# Patient Record
Sex: Male | Born: 1990 | Race: White | Hispanic: No | Marital: Single | Smoking: Current some day smoker
Health system: Southern US, Community
[De-identification: ages and names within clinical notes are randomized; demographics above are authoritative.]

## PROBLEM LIST (undated history)

## (undated) DIAGNOSIS — E1010 Type 1 diabetes mellitus with ketoacidosis without coma: Secondary | ICD-10-CM

## (undated) DIAGNOSIS — E119 Type 2 diabetes mellitus without complications: Secondary | ICD-10-CM

---
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
## 2012-08-06 ENCOUNTER — Emergency Department: Payer: Self-pay | Admitting: Emergency Medicine

## 2012-10-24 ENCOUNTER — Inpatient Hospital Stay: Payer: Self-pay | Admitting: Internal Medicine

## 2012-10-24 LAB — BASIC METABOLIC PANEL
Anion Gap: 10 (ref 7–16)
Anion Gap: 5 — ABNORMAL LOW (ref 7–16)
BUN: 20 mg/dL — ABNORMAL HIGH (ref 7–18)
Calcium, Total: 7.8 mg/dL — ABNORMAL LOW (ref 8.5–10.1)
Calcium, Total: 7.7 mg/dL — ABNORMAL LOW (ref 8.5–10.1)
Chloride: 110 mmol/L — ABNORMAL HIGH (ref 98–107)
Chloride: 110 mmol/L — ABNORMAL HIGH (ref 98–107)
Co2: 20 mmol/L — ABNORMAL LOW (ref 21–32)
Co2: 25 mmol/L (ref 21–32)
Creatinine: 1.38 mg/dL — ABNORMAL HIGH (ref 0.60–1.30)
Creatinine: 1.44 mg/dL — ABNORMAL HIGH (ref 0.60–1.30)
EGFR (African American): >60
Glucose: 200 mg/dL — ABNORMAL HIGH (ref 65–99)
Osmolality: 284 (ref 275–301)
Osmolality: 287 (ref 275–301)
Potassium: 4.4 mmol/L (ref 3.5–5.1)
Potassium: 3.8 mmol/L (ref 3.5–5.1)
Sodium: 140 mmol/L (ref 136–145)

## 2012-10-24 LAB — URINALYSIS, COMPLETE
Bacteria: NONE SEEN
Bilirubin,UR: NEGATIVE
Nitrite: NEGATIVE
Protein: NEGATIVE
RBC,UR: 29 /HPF (ref 0–5)
Squamous Epithelial: NONE SEEN
WBC UR: 1 /HPF (ref 0–5)

## 2012-10-24 LAB — COMPREHENSIVE METABOLIC PANEL
Albumin: 4.7 g/dL (ref 3.4–5.0)
Alkaline Phosphatase: 171 U/L — ABNORMAL HIGH (ref 50–136)
Anion Gap: 26 — ABNORMAL HIGH (ref 7–16)
BUN: 29 mg/dL — ABNORMAL HIGH (ref 7–18)
Calcium, Total: 9.6 mg/dL (ref 8.5–10.1)
Co2: 9 mmol/L — CL (ref 21–32)
Creatinine: 1.45 mg/dL — ABNORMAL HIGH (ref 0.60–1.30)
EGFR (African American): >60
Glucose: 443 mg/dL — ABNORMAL HIGH (ref 65–99)
SGOT(AST): 29 U/L (ref 15–37)
Total Protein: 8 g/dL (ref 6.4–8.2)

## 2012-10-24 LAB — CBC
HCT: 49.1 % (ref 40.0–52.0)
HGB: 16.5 g/dL (ref 13.0–18.0)
RDW: 13 % (ref 11.5–14.5)
WBC: 26.9 10*3/uL — ABNORMAL HIGH (ref 3.8–10.6)

## 2012-10-24 LAB — LIPASE, BLOOD: Lipase: 48 U/L — ABNORMAL LOW (ref 73–393)

## 2012-10-31 LAB — COMPREHENSIVE METABOLIC PANEL
Albumin: 4.5 g/dL (ref 3.4–5.0)
Alkaline Phosphatase: 150 U/L — ABNORMAL HIGH (ref 50–136)
Bilirubin,Total: 2.5 mg/dL — ABNORMAL HIGH (ref 0.2–1.0)
Co2: 11 mmol/L — ABNORMAL LOW (ref 21–32)
Creatinine: 1.47 mg/dL — ABNORMAL HIGH (ref 0.60–1.30)
EGFR (African American): >60
EGFR (Non-African Amer.): >60
Glucose: 319 mg/dL — ABNORMAL HIGH (ref 65–99)
Osmolality: 273 (ref 275–301)
Sodium: 128 mmol/L — ABNORMAL LOW (ref 136–145)
Total Protein: 8.2 g/dL (ref 6.4–8.2)

## 2012-10-31 LAB — CBC
HCT: 50.3 % (ref 40.0–52.0)
HGB: 16.4 g/dL (ref 13.0–18.0)
MCH: 28.9 pg (ref 26.0–34.0)
MCHC: 32.5 g/dL (ref 32.0–36.0)
MCV: 89 fL (ref 80–100)
Platelet: 270 10*3/uL (ref 150–440)
RBC: 5.66 10*6/uL (ref 4.40–5.90)
WBC: 11.4 10*3/uL — ABNORMAL HIGH (ref 3.8–10.6)

## 2012-11-01 ENCOUNTER — Inpatient Hospital Stay: Payer: Self-pay | Admitting: Internal Medicine

## 2012-11-01 LAB — URINALYSIS, COMPLETE
Bacteria: NONE SEEN
Bilirubin,UR: NEGATIVE
Glucose,UR: >=500 mg/dL (ref 0–75)
Leukocyte Esterase: NEGATIVE
Nitrite: NEGATIVE
Protein: 30
RBC,UR: <1 /HPF (ref 0–5)
Specific Gravity: 1.023 (ref 1.003–1.030)
Squamous Epithelial: <1
WBC UR: <1 /HPF (ref 0–5)

## 2012-11-01 LAB — BASIC METABOLIC PANEL
Anion Gap: 11 (ref 7–16)
Calcium, Total: 8 mg/dL — ABNORMAL LOW (ref 8.5–10.1)
Chloride: 108 mmol/L — ABNORMAL HIGH (ref 98–107)
Creatinine: 1.15 mg/dL (ref 0.60–1.30)
EGFR (African American): >60
EGFR (Non-African Amer.): >60
Osmolality: 270 (ref 275–301)
Sodium: 135 mmol/L — ABNORMAL LOW (ref 136–145)

## 2013-05-04 ENCOUNTER — Inpatient Hospital Stay: Payer: Self-pay | Admitting: Internal Medicine

## 2013-05-04 LAB — COMPREHENSIVE METABOLIC PANEL
ANION GAP: 16 (ref 7–16)
Albumin: 3.8 g/dL (ref 3.4–5.0)
Albumin: 2.9 g/dL — ABNORMAL LOW (ref 3.4–5.0)
Alkaline Phosphatase: 236 U/L — ABNORMAL HIGH
Alkaline Phosphatase: 144 U/L — ABNORMAL HIGH
Anion Gap: 26 — ABNORMAL HIGH (ref 7–16)
BILIRUBIN TOTAL: 0.9 mg/dL (ref 0.2–1.0)
BILIRUBIN TOTAL: 0.7 mg/dL (ref 0.2–1.0)
BUN: 14 mg/dL (ref 7–18)
BUN: 13 mg/dL (ref 7–18)
CHLORIDE: 96 mmol/L — AB (ref 98–107)
CO2: 6 mmol/L — AB (ref 21–32)
CREATININE: 1.09 mg/dL (ref 0.60–1.30)
CREATININE: 0.91 mg/dL (ref 0.60–1.30)
Calcium, Total: 8.2 mg/dL — ABNORMAL LOW (ref 8.5–10.1)
Calcium, Total: 7 mg/dL — CL (ref 8.5–10.1)
Chloride: 110 mmol/L — ABNORMAL HIGH (ref 98–107)
Co2: 10 mmol/L — CL (ref 21–32)
EGFR (African American): >60
EGFR (African American): >60
Glucose: 545 mg/dL (ref 65–99)
Glucose: 156 mg/dL — ABNORMAL HIGH (ref 65–99)
OSMOLALITY: 282 (ref 275–301)
Osmolality: 275 (ref 275–301)
POTASSIUM: 3.3 mmol/L — AB (ref 3.5–5.1)
Potassium: 3.9 mmol/L (ref 3.5–5.1)
SGOT(AST): 25 U/L (ref 15–37)
SGOT(AST): 21 U/L (ref 15–37)
SGPT (ALT): 40 U/L (ref 12–78)
SGPT (ALT): 28 U/L (ref 12–78)
SODIUM: 128 mmol/L — AB (ref 136–145)
Sodium: 136 mmol/L (ref 136–145)
TOTAL PROTEIN: 6 g/dL — AB (ref 6.4–8.2)
Total Protein: 7.8 g/dL (ref 6.4–8.2)

## 2013-05-04 LAB — CBC WITH DIFFERENTIAL/PLATELET
BASOS ABS: 0.2 10*3/uL — AB (ref 0.0–0.1)
Basophil %: 2.7 %
EOS ABS: 0.1 10*3/uL (ref 0.0–0.7)
EOS PCT: 2.4 %
HCT: 45.3 % (ref 40.0–52.0)
HGB: 15 g/dL (ref 13.0–18.0)
LYMPHS ABS: 1.5 10*3/uL (ref 1.0–3.6)
Lymphocyte %: 24.5 %
MCH: 32.2 pg (ref 26.0–34.0)
MCHC: 33 g/dL (ref 32.0–36.0)
MCV: 98 fL (ref 80–100)
Monocyte #: 0.4 x10 3/mm (ref 0.2–1.0)
Monocyte %: 6 %
Neutrophil #: 3.9 10*3/uL (ref 1.4–6.5)
Neutrophil %: 64.4 %
Platelet: 339 10*3/uL (ref 150–440)
RBC: 4.64 10*6/uL (ref 4.40–5.90)
RDW: 13.1 % (ref 11.5–14.5)
WBC: 6.1 10*3/uL (ref 3.8–10.6)

## 2013-05-04 LAB — URINALYSIS, COMPLETE
BACTERIA: NONE SEEN
Bilirubin,UR: NEGATIVE
Glucose,UR: >=500 mg/dL (ref 0–75)
LEUKOCYTE ESTERASE: NEGATIVE
Nitrite: NEGATIVE
Ph: 5 (ref 4.5–8.0)
Protein: 30
RBC,UR: <1 /HPF (ref 0–5)
SPECIFIC GRAVITY: 1.022 (ref 1.003–1.030)
Squamous Epithelial: NONE SEEN

## 2013-05-04 LAB — HEMOGLOBIN A1C: Hemoglobin A1C: 12.3 % — ABNORMAL HIGH (ref 4.2–6.3)

## 2013-05-04 LAB — TROPONIN I: Troponin-I: <0.02 ng/mL

## 2013-05-05 LAB — CBC WITH DIFFERENTIAL/PLATELET
Basophil #: 0.1 10*3/uL (ref 0.0–0.1)
Basophil %: 1 %
EOS ABS: 0.2 10*3/uL (ref 0.0–0.7)
EOS PCT: 3.6 %
HCT: 36.6 % — ABNORMAL LOW (ref 40.0–52.0)
HGB: 12.5 g/dL — ABNORMAL LOW (ref 13.0–18.0)
Lymphocyte #: 1.2 10*3/uL (ref 1.0–3.6)
Lymphocyte %: 18.7 %
MCH: 32.2 pg (ref 26.0–34.0)
MCHC: 34.2 g/dL (ref 32.0–36.0)
MCV: 94 fL (ref 80–100)
MONOS PCT: 5.8 %
Monocyte #: 0.4 x10 3/mm (ref 0.2–1.0)
NEUTROS ABS: 4.4 10*3/uL (ref 1.4–6.5)
Neutrophil %: 70.9 %
Platelet: 262 10*3/uL (ref 150–440)
RBC: 3.89 10*6/uL — AB (ref 4.40–5.90)
RDW: 13.2 % (ref 11.5–14.5)
WBC: 6.2 10*3/uL (ref 3.8–10.6)

## 2013-05-05 LAB — COMPREHENSIVE METABOLIC PANEL
ALBUMIN: 2.7 g/dL — AB (ref 3.4–5.0)
ANION GAP: 16 (ref 7–16)
AST: 22 U/L (ref 15–37)
Alkaline Phosphatase: 148 U/L — ABNORMAL HIGH
BUN: 12 mg/dL (ref 7–18)
Bilirubin,Total: 0.7 mg/dL (ref 0.2–1.0)
CHLORIDE: 108 mmol/L — AB (ref 98–107)
Calcium, Total: 7.2 mg/dL — ABNORMAL LOW (ref 8.5–10.1)
Co2: 11 mmol/L — ABNORMAL LOW (ref 21–32)
Creatinine: 1.02 mg/dL (ref 0.60–1.30)
GLUCOSE: 206 mg/dL — AB (ref 65–99)
Osmolality: 276 (ref 275–301)
Potassium: 3 mmol/L — ABNORMAL LOW (ref 3.5–5.1)
SGPT (ALT): 33 U/L (ref 12–78)
SODIUM: 135 mmol/L — AB (ref 136–145)
Total Protein: 5.7 g/dL — ABNORMAL LOW (ref 6.4–8.2)

## 2013-05-05 LAB — BASIC METABOLIC PANEL
ANION GAP: 7 (ref 7–16)
Anion Gap: 8 (ref 7–16)
BUN: 10 mg/dL (ref 7–18)
BUN: 10 mg/dL (ref 7–18)
CALCIUM: 7.5 mg/dL — AB (ref 8.5–10.1)
CHLORIDE: 108 mmol/L — AB (ref 98–107)
CO2: 18 mmol/L — AB (ref 21–32)
CO2: 21 mmol/L (ref 21–32)
Calcium, Total: 7.8 mg/dL — ABNORMAL LOW (ref 8.5–10.1)
Chloride: 108 mmol/L — ABNORMAL HIGH (ref 98–107)
Creatinine: 1.02 mg/dL (ref 0.60–1.30)
Creatinine: 1.09 mg/dL (ref 0.60–1.30)
EGFR (African American): >60
EGFR (Non-African Amer.): >60
EGFR (Non-African Amer.): >60
Glucose: 176 mg/dL — ABNORMAL HIGH (ref 65–99)
Glucose: 159 mg/dL — ABNORMAL HIGH (ref 65–99)
OSMOLALITY: 272 (ref 275–301)
Osmolality: 274 (ref 275–301)
Potassium: 2.7 mmol/L — ABNORMAL LOW (ref 3.5–5.1)
Potassium: 2.8 mmol/L — ABNORMAL LOW (ref 3.5–5.1)
SODIUM: 134 mmol/L — AB (ref 136–145)
Sodium: 136 mmol/L (ref 136–145)

## 2013-05-06 LAB — BASIC METABOLIC PANEL
ANION GAP: 6 — AB (ref 7–16)
Anion Gap: 6 — ABNORMAL LOW (ref 7–16)
BUN: 7 mg/dL (ref 7–18)
BUN: 8 mg/dL (ref 7–18)
CALCIUM: 7.6 mg/dL — AB (ref 8.5–10.1)
CREATININE: 0.81 mg/dL (ref 0.60–1.30)
Calcium, Total: 7.6 mg/dL — ABNORMAL LOW (ref 8.5–10.1)
Chloride: 111 mmol/L — ABNORMAL HIGH (ref 98–107)
Chloride: 114 mmol/L — ABNORMAL HIGH (ref 98–107)
Co2: 22 mmol/L (ref 21–32)
Co2: 22 mmol/L (ref 21–32)
Creatinine: 0.95 mg/dL (ref 0.60–1.30)
EGFR (African American): >60
EGFR (African American): >60
EGFR (Non-African Amer.): >60
GLUCOSE: 70 mg/dL (ref 65–99)
Glucose: 82 mg/dL (ref 65–99)
OSMOLALITY: 280 (ref 275–301)
Osmolality: 275 (ref 275–301)
Potassium: 2.9 mmol/L — ABNORMAL LOW (ref 3.5–5.1)
Potassium: 2.9 mmol/L — ABNORMAL LOW (ref 3.5–5.1)
Sodium: 139 mmol/L (ref 136–145)
Sodium: 142 mmol/L (ref 136–145)

## 2013-05-06 LAB — MAGNESIUM: MAGNESIUM: 1.9 mg/dL

## 2013-07-01 ENCOUNTER — Inpatient Hospital Stay: Payer: Self-pay | Admitting: Internal Medicine

## 2013-07-01 LAB — URINALYSIS, COMPLETE
Bacteria: NONE SEEN
Bilirubin,UR: NEGATIVE
Glucose,UR: >=500 mg/dL (ref 0–75)
Leukocyte Esterase: NEGATIVE
NITRITE: NEGATIVE
PH: 5 (ref 4.5–8.0)
PROTEIN: NEGATIVE
RBC,UR: 6 /HPF (ref 0–5)
SQUAMOUS EPITHELIAL: NONE SEEN
Specific Gravity: 1.026 (ref 1.003–1.030)

## 2013-07-01 LAB — CBC WITH DIFFERENTIAL/PLATELET
BASOS ABS: 0.1 10*3/uL (ref 0.0–0.1)
BASOS PCT: 0.5 %
EOS PCT: 0 %
Eosinophil #: 0 10*3/uL (ref 0.0–0.7)
HCT: 45.4 % (ref 40.0–52.0)
HGB: 14.8 g/dL (ref 13.0–18.0)
LYMPHS PCT: 5.4 %
Lymphocyte #: 0.7 10*3/uL — ABNORMAL LOW (ref 1.0–3.6)
MCH: 29.7 pg (ref 26.0–34.0)
MCHC: 32.5 g/dL (ref 32.0–36.0)
MCV: 91 fL (ref 80–100)
Monocyte #: 0.4 x10 3/mm (ref 0.2–1.0)
Monocyte %: 3.7 %
Neutrophil #: 11 10*3/uL — ABNORMAL HIGH (ref 1.4–6.5)
Neutrophil %: 90.4 %
PLATELETS: 323 10*3/uL (ref 150–440)
RBC: 4.97 10*6/uL (ref 4.40–5.90)
RDW: 12.4 % (ref 11.5–14.5)
WBC: 12.2 10*3/uL — ABNORMAL HIGH (ref 3.8–10.6)

## 2013-07-01 LAB — BASIC METABOLIC PANEL
ANION GAP: 10 (ref 7–16)
Anion Gap: 28 — ABNORMAL HIGH (ref 7–16)
BUN: 15 mg/dL (ref 7–18)
BUN: 11 mg/dL (ref 7–18)
CHLORIDE: 110 mmol/L — AB (ref 98–107)
CO2: 10 mmol/L — AB (ref 21–32)
CREATININE: 1.11 mg/dL (ref 0.60–1.30)
Calcium, Total: 9.1 mg/dL (ref 8.5–10.1)
Calcium, Total: 7.3 mg/dL — ABNORMAL LOW (ref 8.5–10.1)
Chloride: 88 mmol/L — ABNORMAL LOW (ref 98–107)
Co2: 18 mmol/L — ABNORMAL LOW (ref 21–32)
Creatinine: 1.37 mg/dL — ABNORMAL HIGH (ref 0.60–1.30)
EGFR (African American): >60
EGFR (Non-African Amer.): >60
Glucose: 588 mg/dL (ref 65–99)
Glucose: 163 mg/dL — ABNORMAL HIGH (ref 65–99)
Osmolality: 281 (ref 275–301)
Osmolality: 279 (ref 275–301)
POTASSIUM: 4.5 mmol/L (ref 3.5–5.1)
Potassium: 2.9 mmol/L — ABNORMAL LOW (ref 3.5–5.1)
Sodium: 126 mmol/L — ABNORMAL LOW (ref 136–145)
Sodium: 138 mmol/L (ref 136–145)

## 2013-07-02 LAB — BASIC METABOLIC PANEL
Anion Gap: 6 — ABNORMAL LOW (ref 7–16)
BUN: 9 mg/dL (ref 7–18)
CALCIUM: 7.6 mg/dL — AB (ref 8.5–10.1)
Chloride: 109 mmol/L — ABNORMAL HIGH (ref 98–107)
Co2: 23 mmol/L (ref 21–32)
Creatinine: 0.95 mg/dL (ref 0.60–1.30)
Glucose: 152 mg/dL — ABNORMAL HIGH (ref 65–99)
Osmolality: 277 (ref 275–301)
POTASSIUM: 3.7 mmol/L (ref 3.5–5.1)
Sodium: 138 mmol/L (ref 136–145)

## 2013-07-06 LAB — CULTURE, BLOOD (SINGLE)

## 2013-11-27 ENCOUNTER — Inpatient Hospital Stay: Payer: Self-pay | Admitting: Internal Medicine

## 2013-11-27 LAB — URINALYSIS, COMPLETE
BILIRUBIN, UR: NEGATIVE
Blood: NEGATIVE
Glucose,UR: >=500 mg/dL (ref 0–75)
Granular Cast: 18
Leukocyte Esterase: NEGATIVE
NITRITE: NEGATIVE
PH: 6 (ref 4.5–8.0)
RBC,UR: 4 /HPF (ref 0–5)
Specific Gravity: 1.023 (ref 1.003–1.030)
Squamous Epithelial: NONE SEEN
WBC UR: 6 /HPF (ref 0–5)

## 2013-11-27 LAB — COMPREHENSIVE METABOLIC PANEL
Albumin: 3.8 g/dL (ref 3.4–5.0)
Alkaline Phosphatase: 172 U/L — ABNORMAL HIGH
Anion Gap: 26 — ABNORMAL HIGH (ref 7–16)
BUN: 23 mg/dL — ABNORMAL HIGH (ref 7–18)
Bilirubin,Total: 1 mg/dL (ref 0.2–1.0)
Calcium, Total: 8.6 mg/dL (ref 8.5–10.1)
Chloride: 97 mmol/L — ABNORMAL LOW (ref 98–107)
Co2: 9 mmol/L — CL (ref 21–32)
Creatinine: 1.32 mg/dL — ABNORMAL HIGH (ref 0.60–1.30)
EGFR (African American): >60
EGFR (Non-African Amer.): >60
Glucose: 387 mg/dL — ABNORMAL HIGH (ref 65–99)
Osmolality: 284 (ref 275–301)
Potassium: 3.7 mmol/L (ref 3.5–5.1)
SGOT(AST): 26 U/L (ref 15–37)
SGPT (ALT): 59 U/L
Sodium: 132 mmol/L — ABNORMAL LOW (ref 136–145)
Total Protein: 8.1 g/dL (ref 6.4–8.2)

## 2013-11-27 LAB — BASIC METABOLIC PANEL
Anion Gap: 17 — ABNORMAL HIGH (ref 7–16)
Anion Gap: 9 (ref 7–16)
BUN: 20 mg/dL — ABNORMAL HIGH (ref 7–18)
BUN: 17 mg/dL (ref 7–18)
CO2: 19 mmol/L — AB (ref 21–32)
Calcium, Total: 8.1 mg/dL — ABNORMAL LOW (ref 8.5–10.1)
Calcium, Total: 7.9 mg/dL — ABNORMAL LOW (ref 8.5–10.1)
Chloride: 107 mmol/L (ref 98–107)
Chloride: 110 mmol/L — ABNORMAL HIGH (ref 98–107)
Co2: 13 mmol/L — ABNORMAL LOW (ref 21–32)
Creatinine: 1.15 mg/dL (ref 0.60–1.30)
Creatinine: 1.34 mg/dL — ABNORMAL HIGH (ref 0.60–1.30)
EGFR (African American): >60
EGFR (African American): >60
EGFR (Non-African Amer.): >60
EGFR (Non-African Amer.): >60
GLUCOSE: 114 mg/dL — AB (ref 65–99)
Glucose: 114 mg/dL — ABNORMAL HIGH (ref 65–99)
Osmolality: 277 (ref 275–301)
Osmolality: 278 (ref 275–301)
POTASSIUM: 3.5 mmol/L (ref 3.5–5.1)
Potassium: 3.6 mmol/L (ref 3.5–5.1)
Sodium: 137 mmol/L (ref 136–145)
Sodium: 138 mmol/L (ref 136–145)

## 2013-11-27 LAB — CBC
HCT: 48.3 % (ref 40.0–52.0)
HGB: 16.1 g/dL (ref 13.0–18.0)
MCH: 32.6 pg (ref 26.0–34.0)
MCHC: 33.3 g/dL (ref 32.0–36.0)
MCV: 98 fL (ref 80–100)
PLATELETS: 296 10*3/uL (ref 150–440)
RBC: 4.95 10*6/uL (ref 4.40–5.90)
RDW: 14.7 % — ABNORMAL HIGH (ref 11.5–14.5)
WBC: 13.8 10*3/uL — AB (ref 3.8–10.6)

## 2013-12-20 ENCOUNTER — Inpatient Hospital Stay: Payer: Self-pay | Admitting: Internal Medicine

## 2013-12-20 LAB — URINALYSIS, COMPLETE
Bacteria: NONE SEEN
Bilirubin,UR: NEGATIVE
Blood: NEGATIVE
Glucose,UR: >=500 mg/dL (ref 0–75)
Leukocyte Esterase: NEGATIVE
Nitrite: NEGATIVE
Ph: 5 (ref 4.5–8.0)
Protein: 30
RBC,UR: <1 /HPF (ref 0–5)
SPECIFIC GRAVITY: 1.023 (ref 1.003–1.030)
Squamous Epithelial: NONE SEEN

## 2013-12-20 LAB — COMPREHENSIVE METABOLIC PANEL
ALK PHOS: 266 U/L — AB
Albumin: 4.1 g/dL (ref 3.4–5.0)
BUN: 23 mg/dL — ABNORMAL HIGH (ref 7–18)
Bilirubin,Total: 1.1 mg/dL — ABNORMAL HIGH (ref 0.2–1.0)
Calcium, Total: 8.6 mg/dL (ref 8.5–10.1)
Chloride: 94 mmol/L — ABNORMAL LOW (ref 98–107)
Creatinine: 1.17 mg/dL (ref 0.60–1.30)
EGFR (African American): >60
EGFR (Non-African Amer.): >60
Glucose: 585 mg/dL (ref 65–99)
OSMOLALITY: 292 (ref 275–301)
Potassium: 4.5 mmol/L (ref 3.5–5.1)
SGOT(AST): 46 U/L — ABNORMAL HIGH (ref 15–37)
SGPT (ALT): 123 U/L — ABNORMAL HIGH
SODIUM: 130 mmol/L — AB (ref 136–145)
TOTAL PROTEIN: 8 g/dL (ref 6.4–8.2)

## 2013-12-20 LAB — BASIC METABOLIC PANEL
ANION GAP: 15 (ref 7–16)
Anion Gap: 19 — ABNORMAL HIGH (ref 7–16)
BUN: 23 mg/dL — AB (ref 7–18)
BUN: 20 mg/dL — ABNORMAL HIGH (ref 7–18)
BUN: 19 mg/dL — AB (ref 7–18)
CALCIUM: 7.1 mg/dL — AB (ref 8.5–10.1)
CHLORIDE: 109 mmol/L — AB (ref 98–107)
CO2: 5 mmol/L — AB (ref 21–32)
CO2: 10 mmol/L — AB (ref 21–32)
CO2: 13 mmol/L — AB (ref 21–32)
Calcium, Total: 7.6 mg/dL — ABNORMAL LOW (ref 8.5–10.1)
Calcium, Total: 7.4 mg/dL — ABNORMAL LOW (ref 8.5–10.1)
Chloride: 106 mmol/L (ref 98–107)
Chloride: 114 mmol/L — ABNORMAL HIGH (ref 98–107)
Creatinine: 0.98 mg/dL (ref 0.60–1.30)
Creatinine: 1.03 mg/dL (ref 0.60–1.30)
Creatinine: 1.03 mg/dL (ref 0.60–1.30)
EGFR (African American): >60
EGFR (African American): >60
EGFR (Non-African Amer.): >60
EGFR (Non-African Amer.): >60
EGFR (Non-African Amer.): >60
GLUCOSE: 403 mg/dL — AB (ref 65–99)
GLUCOSE: 198 mg/dL — AB (ref 65–99)
Glucose: 82 mg/dL (ref 65–99)
OSMOLALITY: 284 (ref 275–301)
Osmolality: 291 (ref 275–301)
Osmolality: 284 (ref 275–301)
POTASSIUM: 3.9 mmol/L (ref 3.5–5.1)
Potassium: 4.3 mmol/L (ref 3.5–5.1)
Potassium: 4.1 mmol/L (ref 3.5–5.1)
Sodium: 135 mmol/L — ABNORMAL LOW (ref 136–145)
Sodium: 138 mmol/L (ref 136–145)
Sodium: 142 mmol/L (ref 136–145)

## 2013-12-20 LAB — CBC
HCT: 49.1 % (ref 40.0–52.0)
HGB: 15.7 g/dL (ref 13.0–18.0)
MCH: 32.7 pg (ref 26.0–34.0)
MCHC: 31.9 g/dL — ABNORMAL LOW (ref 32.0–36.0)
MCV: 102 fL — ABNORMAL HIGH (ref 80–100)
PLATELETS: 326 10*3/uL (ref 150–440)
RBC: 4.8 10*6/uL (ref 4.40–5.90)
RDW: 15.1 % — ABNORMAL HIGH (ref 11.5–14.5)
WBC: 8.8 10*3/uL (ref 3.8–10.6)

## 2013-12-20 LAB — HEMOGLOBIN A1C: Hemoglobin A1C: 13 % — ABNORMAL HIGH (ref 4.2–6.3)

## 2013-12-21 LAB — CBC WITH DIFFERENTIAL/PLATELET
BASOS ABS: 0 10*3/uL (ref 0.0–0.1)
BASOS PCT: 0.5 %
EOS PCT: 0.2 %
Eosinophil #: 0 10*3/uL (ref 0.0–0.7)
HCT: 35.3 % — ABNORMAL LOW (ref 40.0–52.0)
HGB: 12.4 g/dL — ABNORMAL LOW (ref 13.0–18.0)
LYMPHS ABS: 0.9 10*3/uL — AB (ref 1.0–3.6)
Lymphocyte %: 15.7 %
MCH: 33 pg (ref 26.0–34.0)
MCHC: 35 g/dL (ref 32.0–36.0)
MCV: 95 fL (ref 80–100)
MONOS PCT: 9.2 %
Monocyte #: 0.5 x10 3/mm (ref 0.2–1.0)
NEUTROS PCT: 74.4 %
Neutrophil #: 4.1 10*3/uL (ref 1.4–6.5)
PLATELETS: 210 10*3/uL (ref 150–440)
RBC: 3.74 10*6/uL — AB (ref 4.40–5.90)
RDW: 14.4 % (ref 11.5–14.5)
WBC: 5.5 10*3/uL (ref 3.8–10.6)

## 2013-12-21 LAB — COMPREHENSIVE METABOLIC PANEL
ALBUMIN: 2.8 g/dL — AB (ref 3.4–5.0)
ALK PHOS: 137 U/L — AB
ALT: 78 U/L — AB
ANION GAP: 9 (ref 7–16)
BILIRUBIN TOTAL: 0.9 mg/dL (ref 0.2–1.0)
BUN: 17 mg/dL (ref 7–18)
CHLORIDE: 114 mmol/L — AB (ref 98–107)
CO2: 18 mmol/L — AB (ref 21–32)
CREATININE: 1.22 mg/dL (ref 0.60–1.30)
Calcium, Total: 7.4 mg/dL — ABNORMAL LOW (ref 8.5–10.1)
EGFR (African American): >60
GLUCOSE: 71 mg/dL (ref 65–99)
OSMOLALITY: 281 (ref 275–301)
Potassium: 3.7 mmol/L (ref 3.5–5.1)
SGOT(AST): 33 U/L (ref 15–37)
SODIUM: 141 mmol/L (ref 136–145)
Total Protein: 5.7 g/dL — ABNORMAL LOW (ref 6.4–8.2)

## 2014-05-13 ENCOUNTER — Inpatient Hospital Stay: Payer: Self-pay | Admitting: Internal Medicine

## 2014-05-13 LAB — BASIC METABOLIC PANEL
ANION GAP: 22 — AB (ref 7–16)
Anion Gap: 10 (ref 7–16)
BUN: 27 mg/dL — ABNORMAL HIGH (ref 7–18)
BUN: 21 mg/dL — ABNORMAL HIGH (ref 7–18)
CALCIUM: 6.8 mg/dL — AB (ref 8.5–10.1)
CALCIUM: 7 mg/dL — AB (ref 8.5–10.1)
CHLORIDE: 113 mmol/L — AB (ref 98–107)
CREATININE: 1.12 mg/dL (ref 0.60–1.30)
Chloride: 116 mmol/L — ABNORMAL HIGH (ref 98–107)
Co2: 8 mmol/L — CL (ref 21–32)
Co2: 19 mmol/L — ABNORMAL LOW (ref 21–32)
Creatinine: 1.24 mg/dL (ref 0.60–1.30)
EGFR (African American): >60
EGFR (African American): >60
EGFR (Non-African Amer.): >60
EGFR (Non-African Amer.): >60
GLUCOSE: 110 mg/dL — AB (ref 65–99)
Glucose: 220 mg/dL — ABNORMAL HIGH (ref 65–99)
Osmolality: 297 (ref 275–301)
Osmolality: 292 (ref 275–301)
POTASSIUM: 3.8 mmol/L (ref 3.5–5.1)
Potassium: 3.1 mmol/L — ABNORMAL LOW (ref 3.5–5.1)
Sodium: 143 mmol/L (ref 136–145)
Sodium: 145 mmol/L (ref 136–145)

## 2014-05-13 LAB — COMPREHENSIVE METABOLIC PANEL
ALBUMIN: 3.9 g/dL (ref 3.4–5.0)
ALK PHOS: 264 U/L — AB
BILIRUBIN TOTAL: 0.6 mg/dL (ref 0.2–1.0)
BUN: 33 mg/dL — AB (ref 7–18)
CALCIUM: 8.2 mg/dL — AB (ref 8.5–10.1)
CREATININE: 1.37 mg/dL — AB (ref 0.60–1.30)
Chloride: 87 mmol/L — ABNORMAL LOW (ref 98–107)
Co2: <5 mmol/L — CL (ref 21–32)
EGFR (African American): >60
EGFR (Non-African Amer.): >60
GLUCOSE: 898 mg/dL — AB (ref 65–99)
OSMOLALITY: 311 (ref 275–301)
Potassium: 4.7 mmol/L (ref 3.5–5.1)
SGOT(AST): 30 U/L (ref 15–37)
SGPT (ALT): 28 U/L
SODIUM: 129 mmol/L — AB (ref 136–145)
TOTAL PROTEIN: 7.5 g/dL (ref 6.4–8.2)

## 2014-05-13 LAB — URINALYSIS, COMPLETE
BILIRUBIN, UR: NEGATIVE
Blood: NEGATIVE
Glucose,UR: >=500 mg/dL (ref 0–75)
LEUKOCYTE ESTERASE: NEGATIVE
NITRITE: NEGATIVE
PH: 5 (ref 4.5–8.0)
Protein: 30
RBC, UR: NONE SEEN /HPF (ref 0–5)
Specific Gravity: 1.021 (ref 1.003–1.030)
Squamous Epithelial: NONE SEEN
WBC UR: 1 /HPF (ref 0–5)

## 2014-05-13 LAB — CBC
HCT: 51.3 % (ref 40.0–52.0)
HGB: 15.2 g/dL (ref 13.0–18.0)
MCH: 30.9 pg (ref 26.0–34.0)
MCHC: 29.6 g/dL — ABNORMAL LOW (ref 32.0–36.0)
MCV: 105 fL — ABNORMAL HIGH (ref 80–100)
PLATELETS: 315 10*3/uL (ref 150–440)
RBC: 4.91 10*6/uL (ref 4.40–5.90)
RDW: 14.9 % — AB (ref 11.5–14.5)
WBC: 22.2 10*3/uL — ABNORMAL HIGH (ref 3.8–10.6)

## 2014-05-14 LAB — BASIC METABOLIC PANEL
Anion Gap: 11 (ref 7–16)
BUN: 18 mg/dL (ref 7–18)
CHLORIDE: 114 mmol/L — AB (ref 98–107)
CREATININE: 1.11 mg/dL (ref 0.60–1.30)
Calcium, Total: 7.2 mg/dL — ABNORMAL LOW (ref 8.5–10.1)
Co2: 19 mmol/L — ABNORMAL LOW (ref 21–32)
EGFR (African American): >60
Glucose: 61 mg/dL — ABNORMAL LOW (ref 65–99)
OSMOLALITY: 287 (ref 275–301)
Potassium: 3 mmol/L — ABNORMAL LOW (ref 3.5–5.1)
SODIUM: 144 mmol/L (ref 136–145)

## 2014-05-14 LAB — WBC: WBC: 9.5 10*3/uL (ref 3.8–10.6)

## 2014-05-15 LAB — BASIC METABOLIC PANEL
Anion Gap: 15 (ref 7–16)
BUN: 11 mg/dL (ref 7–18)
CHLORIDE: 104 mmol/L (ref 98–107)
Calcium, Total: 7.7 mg/dL — ABNORMAL LOW (ref 8.5–10.1)
Co2: 19 mmol/L — ABNORMAL LOW (ref 21–32)
Creatinine: 0.82 mg/dL (ref 0.60–1.30)
EGFR (African American): >60
GLUCOSE: 490 mg/dL — AB (ref 65–99)
OSMOLALITY: 297 (ref 275–301)
POTASSIUM: 3.9 mmol/L (ref 3.5–5.1)
SODIUM: 138 mmol/L (ref 136–145)

## 2014-05-16 LAB — BASIC METABOLIC PANEL
ANION GAP: 5 — AB (ref 7–16)
BUN: 8 mg/dL (ref 7–18)
CALCIUM: 7.5 mg/dL — AB (ref 8.5–10.1)
CREATININE: 0.81 mg/dL (ref 0.60–1.30)
Chloride: 107 mmol/L (ref 98–107)
Co2: 28 mmol/L (ref 21–32)
EGFR (Non-African Amer.): >60
Glucose: 254 mg/dL — ABNORMAL HIGH (ref 65–99)
Osmolality: 286 (ref 275–301)
Potassium: 3.5 mmol/L (ref 3.5–5.1)
Sodium: 140 mmol/L (ref 136–145)

## 2014-05-16 LAB — HEMOGLOBIN A1C: Hemoglobin A1C: 11.9 % — ABNORMAL HIGH (ref 4.2–6.3)

## 2014-08-12 NOTE — Discharge Summary (Signed)
PATIENT NAME:  [name], [name] MR#:  [id number] DATE OF BIRTH:  [birth year]  DATE OF ADMISSION:  11/01/2012 DATE OF DISCHARGE:  11/01/2012  DISCHARGE DIAGNOSES: 1.  Diabetic ketoacidosis due to insulin pump tube failure.  2.  Acute renal failure secondary to dehydration and vomiting.  3.  Electrolyte imbalance due to diabetic ketoacidosis.   CONDITION ON DISCHARGE: Stable.   CODE STATUS:  FULL CODE.     MEDICATIONS ON DISCHARGE: Insulin glargine 8 units subcutaneous once a day, insulin aspart 4 times a day before meals and at bedtime after checking blood sugar level as per sliding scale.   DIET ON DISCHARGE: Carbohydrate-controlled ADA diet.   DIET CONSISTENCY: Regular.   ACTIVITY: As tolerated.   FOLLOWUP:  Timeframe within 1 to 2 weeks for followup. [facility] and was advised to make an appointment within the next 2 to 3 weeks, adjusting insulin pump parts and settings over there.  Until then, keep on using injectable insulin manually.   HISTORY OF PRESENTING ILLNESS: A 24 year old male with type 1 diabetes mellitus who was recently admitted to hospital last week with DKA due to some problem with the insulin pump and discharged home. He again tried to use his insulin pump. His tube of the pump was kinked and was not working properly, and so forth 2 days his blood sugar remained high, and he started vomiting and presented again with DKA.   HOSPITAL COURSE:  He was admitted to Critical Care Unit with insulin drip and DKA protocol. His blood sugar level came under control, acidosis resolved. Started on diet and given Lantus long-acting insulin. He was advised not to use pump any more until he speaks to his endocrinologist, get an appointment and try to get his pump fixed up.  His mother was present in the room and explained this plan to her also. They both agreed, and the patient was discharged home with prescription for insulin.   Other medical issues on the admission:   1.  He had electrolyte imbalance, slight hyponatremia and hyperkalemia:  Due to his DKA, which were corrected with correction of DKA and IV fluids.  2.  Acute renal insufficiency:  This was also due to dehydration, DKA and vomiting which was corrected with IV fluids.  IMPORTANT LABORATORY RESULTS IN THE HOSPITAL STAY:  Hemoglobin A1c was 10.3. His WBC was 11.4 and hemoglobin was 16.4. BUN was 24, creatinine 1.47 on presentation, potassium was 5.6, sodium was 128, and glucose was 319. His CO2 level was 11, pH was 7.04 in venous blood and pCO2 was 24 on presentation. Urinalysis was grossly negative. The next day, creatinine on follow-up came to 1.15 after hydration.   TOTAL TIME SPENT ON THIS DISCHARGE: 35 minutes.   ____________________________ [name], MD vgv:cb D: 11/02/2012 15:57:19 ET T: 11/02/2012 17:32:35 ET JOB#: [id number]  cc: [name], MD, <Dictator> Altamese DillingVAIBHAVKUMAR Jess Sulak MD ELECTRONICALLY SIGNED 11/05/2012 7:56

## 2014-08-12 NOTE — H&P (Signed)
PATIENT NAME:  AESON, SAWYERS MR#:  [id number] DATE OF BIRTH:  Aug 14, 1990  DATE OF ADMISSION:  11/01/2012  PRIMARY CARE PHYSICIAN:  Follows up at [facility].   CHIEF COMPLAINT:  Nausea, feeling weak.   REFERRING PHYSICIAN:  Dr. Lucrezia Europe.  HISTORY OF PRESENT ILLNESS:  Mr. Hu is a 24 year old with type 1 diabetes mellitus who had recent admission for DKA felt to be from insufficient amount of insulin, was discharged home on 10/25/2012.  The patient was doing well until yesterday.  Noted to have kink in the tubing of the insulin pump.  Corrected it and placed back.  The patient continued to have elevated blood sugars.  Again, removed and corrected it.  However, even though had some improvement in blood sugars, however continued to have worsening of the blood sugars associated with nausea.  No episodes of vomiting.  Work-up in the Emergency Department, the patient was found to have a pH of 7.03, bicarb of 9, blood sugars of 445.  The patient was given IV fluids, initiated on insulin drip.  The patient continued to have nausea.  The patient is also found to have elevated white blood cell count of 26,000.  No obvious signs of infection are found.  UA is clear.  Chest x-ray does not show any obvious infiltrates.  Denies having any abdominal pain.  Denies having any cough.   PAST MEDICAL HISTORY:  Diabetes mellitus type 1 diagnosed seven years back.  The last hemoglobin A1c was 14.   PAST SURGICAL HISTORY:  None.   ALLERGIES:  No known drug allergies.   HOME MEDICATIONS:  Insulin, currently on insulin pump.   SOCIAL HISTORY:  He is in the 11th grade, has a learning disability.  No history of smoking, drinking, alcohol or using illicit drugs, lives at home with his mother.   FAMILY HISTORY:  Positive for diabetes mellitus in the father.   REVIEW OF SYSTEMS: CONSTITUTIONAL:  Generalized weakness, fatigue.  EYES:  No change in vision.  EARS, NOSE, THROAT:  No change in hearing.  No tinnitus.   RESPIRATORY:  No cough, shortness of breath.  CARDIOVASCULAR:  No chest pain, palpitations.  GASTROINTESTINAL:  Has nausea.  No episodes of vomiting, mild abdominal pain.  GENITOURINARY:  No dysuria or hematuria.  He is noted to have frothy urine.  ENDOCRINE:  Has a diagnosis of diabetes mellitus.  HEMATOLOGIC:  No easy bruising or bleeding.  SKIN:  No rash or lesions.  MUSCULOSKELETAL:  No joint pains and aches.  NEUROLOGIC:  No weakness or numbness in any part of the body.  PSYCHIATRIC:  No history of depression.   PHYSICAL EXAMINATION: GENERAL:  This is a well-built, well-nourished, thin built male lying down in the bed in discomfort secondary to nausea.  VITAL SIGNS:  Temperature 97.7, pulse 122, blood pressure 137/63, respiratory rate of 20, oxygen saturation is 100% on room air.  HEENT:  Head normocephalic, atraumatic.  Eyes, no scleral icterus.  Conjunctivae normal.  Pupils equal and react to light.  Extraocular movements are intact.  Mucous membranes, mild dryness.  No pharyngeal erythema.  NECK:  Supple.  No lymphadenopathy.  No JVD.  No carotid bruit.  No thyromegaly.  CHEST:  Has no focal tenderness.  LUNGS:  Bilaterally clear to auscultation.  HEART:  S1, S2 regular.  No murmurs are heard.  No pedal edema.  Pulses 2+.  ABDOMEN:  Bowel sounds plus, experiences severe discomfort, caused him to have worsening of the nausea.  No  rebound or guarding.  EXTREMITIES:  No pedal edema.  Pulses 2+.  MUSCULOSKELETAL:  Good range of motion in all the extremities.  SKIN:  No rash or lesions.   NEUROLOGIC:  The patient is alert, oriented to place, person and time.  Cranial nerves II through XII intact.  Motor 5 by 5 in upper and lower extremities.  No sensory loss.   LABORATORY AND DIAGNOSTIC STUDIES:  CBC, WBC of 27,000, hemoglobin 16.5, platelet count of 254.  CMP, BUN 29, creatinine of 1.45, potassium 5.5, sodium 129.    Anion gap of 26.   Lipase 48.    Chest x-ray, one view  portable:  No acute cardiopulmonary disease.   UA negative for nitrites and leukocyte esterase.   ASSESSMENT AND PLAN:  Mr. Sharol HarnessSimmons is a 24 year old male who comes to the Emergency Department with diabetic ketoacidosis.  1.  Diabetic ketoacidosis.  This is secondary to malfunctioning of the pump.  However, there is also concern about noncompliance having the patient's hemoglobin A1c of 14 or insufficiency of insulin.  The patient [facility].  For now we will treat the diabetic ketoacidosis by giving another two additional liters of fluid.  The patient already received 1 liter of fluid in the Emergency Department.  Started on insulin drip by the Emergency Department physician.  We will start the patient on 0.1  per kg body weight.  When the blood sugars are less than 250 we will change the insulin drip to 4 units and keep the patient on D5 half-normal saline.  We will continue with Accu-Cheks q. 1 hour.  Continue to correct the electrolytes.  When anion gap closes, we will give the long-acting insulin.  2.  Diabetes mellitus, insulin-dependent, on insulin pump, currently malfunctioning.  We will discharge the patient with Lantus.  The patient will need to follow up with the endocrinologist.  3.  Hyponatremia secondary to pseudohyponatremia as well as dehydration.  Continue with IV fluids and follow up.  4.  Acute renal insufficiency secondary to dehydration.  Continue with IV fluids and follow up.  5.  Hyperkalemia secondary to diabetic ketoacidosis.  Once the patient will be on insulin, the patient's potassium will be corrected.  We will continue to follow up.  6.  Elevated liver enzymes with a cholestatic pattern.  We will continue to follow up.  7.  Keep the patient on DVT prophylaxis with Lovenox.   TIME SPENT:  50 minutes.    ____________________________ Susa GriffinsPadmaja Kriya Westra, MD pv:ea D: 11/01/2012 00:38:00 ET T: 11/01/2012 01:59:33 ET JOB#: [id number]  cc: Susa GriffinsPadmaja  Adiana Smelcer, MD, <Dictator> Susa GriffinsPADMAJA Jenavie Stanczak MD ELECTRONICALLY SIGNED 11/04/2012 0:22

## 2014-08-12 NOTE — H&P (Signed)
PATIENT NAME:  [name], [name] MR#:  [id number] DATE OF BIRTH:  1990/12/19  DATE OF ADMISSION:  10/24/2012  PRIMARY CARE PHYSICIAN: At [facility].   CHIEF COMPLAINT: Nausea, vomiting and dehydration.   HISTORY OF PRESENT ILLNESS: Makaio Mach is a 24 year old Caucasian male with history of type 1 diabetes, on insulin pump, who comes to the Emergency Room after he started having significant nausea, vomiting and abdominal discomfort. He came to the Emergency Room and was found to have blood glucose in the 400s. He was also noted to be acidotic with diabetic ketoacidosis. He is clinically dehydrated. Being admitted for diabetic ketoacidosis and type 1 diabetes.   The patient reports going to the pool yesterday and remained there for several hours without hydrating himself. He started having significant nausea and vomiting thereafter.   PAST MEDICAL HISTORY:  1. Type 1 diabetes, on insulin pump.  2. History of DKA in the past. His last episode was about a year ago.    ALLERGIES: No known drug allergies.   SOCIAL HISTORY: Smokes cigarettes every day. Drinks alcohol occasionally. Denies any history of any drug use. He used to use marijuana in the past.   FAMILY HISTORY: Positive for diabetes.   REVIEW OF SYSTEMS:  CONSTITUTIONAL: No fever. Positive for fatigue, weakness.  EYES: No blurred or double vision, glaucoma or cataracts.  ENT: No tinnitus, ear pain, hearing loss.  RESPIRATORY: No cough, shortness of breath or hemoptysis.  CARDIOVASCULAR: No chest pain, orthopnea, edema or hypertension.  GASTROINTESTINAL: Positive for nausea, vomiting, abdominal discomfort. No hematemesis. Positive for GERD.  GENITOURINARY: No dysuria or hematuria.  ENDOCRINE: No polyuria, nocturia or thyroid problems. Positive for type 1 diabetes.  SKIN: No acne or rash.  MUSCULOSKELETAL: No arthritis, inflammation of the joints or gout.  NEUROLOGIC: No CVA, TIA, dysarthria or   numbness.  PSYCHIATRIC: No anxiety or depression.  All other systems reviewed are negative.   PHYSICAL EXAMINATION:  GENERAL: The patient is awake, alert, oriented x3, moderate distress due to nausea and vomiting. VITAL SIGNS: Afebrile, pulse is 111, respirations are 18, blood pressure is 130/58, sats are 96%.  HEENT: Atraumatic, normocephalic. PERRLA. EOM intact. Oral mucosa is dry. Poor dentition. NECK: Supple. No JVD. No carotid bruit.  RESPIRATORY: Clear to auscultation bilaterally. No rales, rhonchi, respiratory distress or labored breathing.  CARDIOVASCULAR: Both the heart sounds are normal. Tachycardia present. No murmur heard. PMI not lateralized. Chest is nontender. Good pedal pulses, good femoral pulses. No lower extremity edema.  ABDOMEN: Soft, nontender. No organomegaly. Positive bowel sounds. NEUROLOGIC: Grossly intact cranial nerves II through XII. No motor or sensory deficits.  PSYCHIATRIC: The patient is awake, alert, oriented x3.  SKIN: Warm and dry.   DIAGNOSTIC STUDIES: UA negative for UTI. Positive for 2+ ketones. The pH is 7.08, pCO2 is 28. Chest x-ray: No acute cardiopulmonary abnormality. White count is 26.9, H and H is 16.5 and 49.1. Glucose is 443, BUN is 29, creatinine is 1.45, sodium is 129, potassium is 5.5, chloride is 94, bicarbonate is 9, anion gap is 26.   ASSESSMENT AND PLAN: The 24 year old [name], with history of type 1 diabetes, comes in with nausea, vomiting and abdominal discomfort to the Emergency Room and is being admitted with:   1. Diabetic ketoacidosis in type 1 diabetes. The patient will be kept n.p.o. except ice chips. IV fluids for aggressive hydration and insulin drip will be continued with diabetic ketoacidosis protocol. Will admit the patient to the CCU, monitor  electrolytes frequently.  2. Acute renal failure in the setting of diabetic ketoacidosis with nausea and vomiting, appears prerenal azotemia. Continue IV fluids and follow up  creatinine. Follow up I's and O's. 3. Hyperkalemia secondary to nausea, vomiting and diabetic ketoacidosis. The patient has been started on insulin drip, which was likely correct the hyperkalemia.  4. Leukocytosis, appears reactive. So far, no source of infection was identified. Will continue to monitor white count.  5. Elevated bilirubin, with normal LFTs. Ultrasound of the abdomen per ER physician is negative. Will continue to monitor bilirubin levels.  6. Deep vein thrombosis prophylaxis with subcutaneous Lovenox.  7. Gastrointestinal prophylaxis with IV Zantac.  8. Further workup according to the patient's clinical course.   Hospital admission plan was discussed with the patient and the patient's family member.   CRITICAL CARE TIME SPENT: 50 minutes.   ____________________________ Wylie HailSona A. Allena KatzPatel, MD sap:OSi D: 10/24/2012 07:26:52 ET T: 10/24/2012 07:41:35 ET JOB#: [id number]  cc: Burleigh Brockmann A. Allena KatzPatel, MD, <Dictator> Willow OraSONA A Kymere Fullington MD ELECTRONICALLY SIGNED 11/09/2012 18:58

## 2014-08-12 NOTE — Discharge Summary (Signed)
PATIENT NAME:  [name], [name] MR#:  [id number] DATE OF BIRTH:  03-09-1991  DATE OF ADMISSION:  10/24/2012 DATE OF DISCHARGE:  10/25/2012  PRESENTING COMPLAINT: Nausea, vomiting and acidosis.   DISCHARGE DIAGNOSES: 1.  Diabetic ketoacidosis with type 1 diabetes, resolved.  2.  Acute renal failure, improved.  3.  Hyperkalemia, resolved, due to diabetic ketoacidosis.  4.  Leukocytosis secondary to diabetic ketoacidosis, appears reactive.    MEDICATIONS AT DISCHARGE: 1.  Insulin pump per your sliding scale and regimen.  2.  Follow up with [facility] in 1 to 2 weeks.   LABORATORY AND RADIOLOGICAL DATA:  At discharge, glucose is 209, BUN is 17, creatinine 1.44, sodium is 138, potassium is 3.8. Urinalysis negative for urinary tract infection.  Abdominal ultrasound: Normal gallbladder. Pancreas is normal.  Anion gap at admission was 26.   BRIEF SUMMARY OF HOSPITAL COURSE: [name] is a 24 year old Caucasian gentleman with history of type 1 diabetes, comes to the Emergency Room with nausea, vomiting and abdominal discomfort. He was admitted with:   1.  Diabetic ketoacidosis with type 1 diabetes:  Received IV fluids for aggressive hydration and insulin drip. Once anion gap closed, the patient was switched back to his home insulin pump. Sugars remained stable. He was able to tolerate p.o. diet.  2.  Acute renal failure in the setting of diabetic ketoacidosis with nausea and vomiting:  Appears prerenal azotemia. Received IV fluids and follow-up creatinine was stable.  3.  Hyperkalemia secondary to diabetic ketoacidosis:  Resolved with insulin drip.  4.  Leukocytosis: Appears reactive. No infection identified.  5.  Elevated bilirubin with normal liver function tests:  Ultrasound of the abdomen was normal.   The hospital stay otherwise remained stable.  CODE STATUS: The patient remained a FULL CODE.   TIME SPENT: 40 minutes.   ____________________________ Wylie HailSona A. Allena KatzPatel,  MD sap:cb D: 10/25/2012 11:11:06 ET T: 10/25/2012 16:39:29 ET JOB#: [id number]  cc: Ligaya Cormier A. Allena KatzPatel, MD, <Dictator> Willow OraSONA A Kimori Tartaglia MD ELECTRONICALLY SIGNED 11/09/2012 18:58

## 2014-08-13 NOTE — Discharge Summary (Signed)
PATIENT NAME:  [name], [place] E MR#:  [id number] DATE OF BIRTH:  November 11, 1990  DATE OF ADMISSION:  12/20/2013 DATE OF DISCHARGE:  12/22/2013  DISCHARGE DIAGNOSES: 1.  Diabetic ketoacidosis.  2.  Dehydration.   DISCHARGE MEDICATIONS:  1.  Omeprazole 20 mg oral once a day as needed.  2.  Levemir 14 units subcutaneous once a day at bedtime.  3.  Humalog 3 units subcutaneous 3 times a day before meals.   DISCHARGE INSTRUCTIONS: Carbohydrate-controlled diet.  Activity as tolerated. Follow up with Dr. Tedd SiasSolum in 1 to 2 weeks. The patient has been requested to be compliant with his insulin and have a consistent diet.   ADMITTING HISTORY AND PHYSICAL:  Please see detailed H and P dictated previously by Dr. Cherlynn KaiserSainani. In brief, a 24 year old Caucasian male patient brought into the hospital complaining of elevated blood sugars and nausea. He was found to be in diabetic ketoacidosis. Started on an insulin drip, admitted to Critical Care Unit. The patient was transitioned to long-acting Lantus of 20 units that he takes at home, but had episodes of hypoglycemia after which.  This has been decreased to 14 units after consultation with Dr. Tedd SiasSolum and his blood sugars are fairly controlled. He has also been added on Humalog 3 units prior to meals.   The patient has been homeless for a month.  Has been living out of his car or at his grandmother's place. Help was sought from social work. The patient's options were discussed and presented to him and his mother at bedside prior to discharge.    PHYSICAL EXAMINATION:    ABDOMEN:  Prior to discharge, the patient's abdomen is nontender.  LUNGS: Sound clear.  HEART:  S1, S2 heard without any murmurs.   TIME SPENT ON DAY OF DISCHARGE IN DISCHARGE ACTIVITY: Was 42 minutes.    ____________________________ Molinda BailiffSrikar R. Larose Batres, MD srs:lr D: 12/23/2013 11:54:51 ET T: 12/23/2013 12:06:15 ET JOB#: [id number]  cc: Wardell HeathSrikar R. Angelize Ryce, MD, <Dictator> Orie FishermanSRIKAR R Karianne Nogueira  MD ELECTRONICALLY SIGNED 01/06/2014 16:29

## 2014-08-13 NOTE — Consult Note (Signed)
PATIENT NAME:  [name], [name] MR#:  [id number] DATE OF BIRTH:  [birth year]  DATE OF CONSULTATION:  12/20/2013  REQUESTING PROVIDER:  Rolly Pancake. Cherlynn Kaiser, MD CONSULTING PHYSICIAN:  A. Wendall Mola, MD  CHIEF COMPLAINT: Diabetic ketoacidosis.   HISTORY OF PRESENT ILLNESS: This is a 24 year old male seen in consultation with diabetic ketoacidosis. He has a long history of type 1 diabetes, poorly controlled. He presented today with feeling very poorly, having 2 days of nausea. Initial blood sugar was 585, bicarbonate less than 5, glucose in the urine greater than 500, and 2+ urinary ketones consistent with diabetic ketoacidosis. He was started on IV fluids and IV insulin. Blood sugars have improved over the course of this evening. The patient is well known to me. He has had multiple prior hospitalizations for diabetic ketoacidosis, last was about 2 weeks ago at this facility. He also was hospitalized here in March 2015, January 2015, and July 2014.  He is noncompliant with his insulin regimen. He states he has not taken his Levemir over the last 2 days and also has been infrequently taking his Humalog. He admits he has not checked blood sugars in several months.   PAST MEDICAL HISTORY: 1.  Type 1 diabetes.  2.  Recurrent diabetic ketoacidosis.  3.  GERD.  MEDICATIONS:  1.  IV regular insulin. 2.  IV fluids 0.9% at 150 mL/hour.  3.  Protonix 40 mg daily.   ALLERGIES:  NO KNOWN DRUG ALLERGIES. HE HAS AN INTOLERANCE TO BENADRYL WHICH CAUSES HYPERACTIVITY.  FAMILY HISTORY: Grandmother had type 1 diabetes.   SOCIAL HISTORY: The patient lives with his mother. He and his mother were evicted from their apartment several months ago. Since that time, they have been living in either motels, their car, or with a grandmother. He is employed. He works at [employer]. Denies tobacco or alcohol use.   REVIEW OF SYSTEMS:  GENERAL: He has had weight loss. By our scale, weight is down about 50 pounds from March  2015. Denies fevers.  HEENT: Denies blurred vision. Denies sore throat.  NECK: Denies neck pain or dysphagia.  CARDIAC: Denies chest pain and palpitation.  PULMONARY: Denies cough. Denies shortness of breath.  ABDOMEN: Reports poor appetite. Reports nausea.  EXTREMITIES: Denies focal myalgias or arthralgias.  SKIN: Denies rash or pruritus.  ENDOCRINE: Denies heat or cold intolerance.  HEMATOLOGIC: Denies easy bruisability or recent bleeding.   PHYSICAL EXAMINATION: VITAL SIGNS: Height 68.9 inches, weight 113 pounds, BMI 16.  GENERAL: This is a very thin, white male who appears very malnourished, chronically ill.  HEENT: EOMI, oropharynx is clear.  NECK: Supple. No thyromegaly.  CARDIAC: Tachycardia without murmur.  PULMONARY: Tachypnea. No wheeze or rhonchi.  ABDOMEN: Diffusely soft, nontender, nondistended.  EXTREMITIES: No peripheral edema present. Normal motor tone. SKIN: No rash or dermatopathy is present.  PSYCHIATRIC: Calm, cooperative.  NEUROLOGIC: Alert and oriented.   LABORATORY DATA:  Glucose 403, BUN 23, creatinine 0.98, sodium 135, potassium 4.5, bicarbonate less than 5. Hemoglobin A1c 13%. AST 46, ALT 123, alkaline phosphatase 266, albumin 4.1, hematocrit 49.1%.   RADIOLOGY: Right upper quadrant ultrasound showed no acute findings.   ASSESSMENT: 1.  Diabetic ketoacidosis.  2.  Type 1 diabetes, chronically uncontrolled.  3.  Elevated liver function tests.  4.  Medication noncompliance.   IMPRESSION:  Cause of DKA is noncompliance with insulin. This has also been the cause of his multiple other admissions for diabetic ketoacidosis. He has long proven he is unable or unable to  adequately care for himself. He is critically ill, has lost significant weight in the last several months due to medication noncompliance. I counseled him at length about his lifestyle choices, behavior choices, and potential consequences of his chronically uncontrolled diabetes.    RECOMMENDATIONS: 1.  Continuing IV fluid and IV insulin as you are. We can transition tomorrow to subcutaneous insulin, once anion gap has closed.  2.  Keep n.p.o. until we have transitioned to subcutaneous insulin.  3.  Will get case management involved.   This was a complicated visit with a patient who ic critically ill in the critical care unit.  Thank you for the kind request for consultation. I will follow along with you.    ____________________________ A. Wendall MolaMelissa Travian Kerner, MD ams:LT D: 12/20/2013 21:33:01 ET T: 12/20/2013 23:10:47 ET JOB#: [id number]  cc: A. Wendall MolaMelissa Diyari Cherne, MD, <Dictator> Macy MisA. MELISSA Alicia Ackert MD ELECTRONICALLY SIGNED 12/26/2013 11:15

## 2014-08-13 NOTE — Discharge Summary (Signed)
PATIENT NAME:  [name], [name] MR#:  [id number] DATE OF BIRTH:  12/01/1990  DATE OF ADMISSION:  07/01/2013 DATE OF DISCHARGE:  07/02/2013  PRESENTING COMPLAINT: Sugars out of control.   DISCHARGE DIAGNOSES: 1.  Acute diabetic ketoacidosis.  2.  Type 1 diabetes, on insulin pump.  3.  Papular rash.   CONDITION ON DISCHARGE: Fair.   MEDICATIONS:  1.  Omeprazole 20 mg p.o. daily as needed.  2.  Insulin aspart 100 units according to your sliding scale.  3.  Levemir 20 units at bedtime.  4.  Humulin R according to your sliding scale.  5.  Clindamycin 300 mg 2 capsules every 8 hours.   DISCHARGE INSTRUCTIONS:   1.  The patient was advised to start using his insulin pump once his supplies are available.  2.  Follow up with Dr. Tedd SiasSolum in 1 to 2 weeks. 3.  Carbohydrate-controlled diet.  LABORATORY DATA: Basic metabolic panel within normal limits except chloride of 109, calcium of 7.6, glucose of 152.   CONSULTATIONS: Endocrine consultation with Dr. Tedd SiasSolum.   BRIEF SUMMARY OF HOSPITAL COURSE: [name] is a 24 year old Caucasian gentleman with past medical history of type 1 diabetes, comes in with:  1.  Acute diabetic ketoacidosis. The patient reports missed getting his insulin pump supplies and has not been getting his insulin via the pump for the last 2 days. He was found to have acute diabetic ketoacidosis. Received aggressive IV hydration and insulin drip, changed to insulin detemir 20 units along with sliding scale insulin and Apidra, which was recommended by Dr. Tedd SiasSolum. The patient will resume his insulin pump according to Dr. Tedd SiasSolum once his supply is delivered at home.  2.  Nausea and vomiting secondary to DKA, resolved.  3.  Acute renal failure, dehydration from diabetic ketoacidosis, resolved after aggressive IV hydration.  4.  Acne-like rash over the torso. Received clindamycin t.i.d.  5.  Type 1 diabetes, per Dr. Pricilla HandlerSolum's recommendation.   Hospital stay otherwise remained  stable. The patient remained a full code.   TIME SPENT: 40 minutes.  ____________________________ Wylie HailSona A. Allena KatzPatel, MD sap:jcm D: 07/06/2013 14:53:58 ET T: 07/06/2013 20:14:15 ET JOB#: [id number]  cc: Fareed Fung A. Allena KatzPatel, MD, <Dictator> Willow OraSONA A Owin Vignola MD ELECTRONICALLY SIGNED 07/09/2013 10:44

## 2014-08-13 NOTE — Consult Note (Signed)
Chief Complaint and History:  Referring Physician Dr. Denzil MagnusonViackute   Chief Complaint DKA   Allergies:  Benadryl: Other  Assessment/Plan:  Assessment/Plan 24 yo M with type 1 diabetes since age 89 admitted yesterday with DKA. Hgb A1c is 12.2%. Diabetes chronically uncontrolled. Over the last 6 months, he's not taken basal insulin, only take Humalog insulin at doses from 10-100 units qid based on blood sugars. He does not carbohydrate count. not keeping regular f/u with Endocrinology. Wants to see local Endocrine. Wants to get back on insulin pump. Sugars now controlled, eating, no N/V, remains on IV insulin + IV dextrose.  A/ DKA, resolved DM1 uncontrolled  P/ Start basal + bolus insulin with Levemir 20 units daily and Novolog 6 units tid AC + NovoLog SSI. Stop IV insulin 2 hours after Levemir is given. Stop IV dextrose when IV insulin is stopped. Low carb diet  Full consult will be dictated.   Electronic Signatures: Raj JanusSolum, Anna M (MD)  (Signed 14-Jan-15 16:35)  Authored: Chief Complaint and History, ALLERGIES, Assessment/Plan   Last Updated: 14-Jan-15 16:35 by Raj JanusSolum, Anna M (MD)

## 2014-08-13 NOTE — Consult Note (Signed)
PATIENT NAME:  [name], [name] MR#:  [id number] DATE OF BIRTH:  12/16/1990  DATE OF CONSULTATION:  05/05/2013  ENDOCRINE CONSULTATION  REQUESTING PHYSICIAN:  Adrian SaranSital Mody, MD CONSULTING PHYSICIAN:  A. Wendall MolaMelissa Solum, MD  CHIEF COMPLAINT: Diabetic ketoacidosis.   HISTORY OF PRESENT ILLNESS: This is a 24 year old male seen in consultation for diabetic ketoacidosis. The patient presented yesterday to the ER with 1 week nausea, vomiting and malaise. Initial blood sugar was 545 on serum testing with a bicarb of 6, elevated anion gap of 26 and elevated urine ketones consistent with diabetic ketoacidosis. He had no source of infection, may have had a preceding viral syndrome, was initiated on IV fluids and IV insulin, and blood sugars have gradually improved. Currently he is on IV insulin, as well as IV dextrose and blood sugars today have been in the range of 120 to 176 on fingerstick and serum testing. He is eating a full diet. Reports good appetite. States nausea, vomiting has resolved. Mother is at the bedside. She assisted with the history.   The patient was diagnosed with type 1 diabetes at age 89. He has been following with [facility]. He estimates he was last seen there about 6 to 7 months ago. He has been hospitalized 2 times in 2014 for diabetic ketoacidosis. Those episodes of DKA were attributed to problems with his insulin pump. He has no longer been using the insulin pump, seems that was stopped after hospitalization in July 2014. He had an [employer] insulin pump. Since that hospitalization, he has only been taking insulin aspart at a dose of between 10 to 100 units q.i.d. He states he checks his blood sugars about 4 times daily. Again, he has not been using any basal insulin. His current hemoglobin A1c is 12.3% and diabetes is uncontrolled. The patient denies any known complications from diabetes.   PAST MEDICAL HISTORY: 1.  Type 1 diabetes.  2.  Recurrent diabetic ketoacidosis.  3.   GERD.  OUTPATIENT MEDICATIONS: 1.  Humalog insulin 4 times daily at a dose of 10 to 100 units as directed.  2.  Prilosec 20 mg daily.   ALLERGIES: THE PATIENT STATES BENADRYL CAUSES HYPERACTIVITY.   SOCIAL HISTORY: The patient quit tobacco 6 months ago. He occasionally drinks alcohol. He lives with his mother. He is single.   FAMILY HISTORY:  A grandmother had type 1 diabetes.  REVIEW OF SYSTEMS:  GENERAL: Denies weight loss. Denies fever.  HEENT: Denies headache. Denies blurred vision.  NECK: Denies neck pain or dysphagia.  CARDIAC: Denies chest pain or palpitation.  PULMONARY: Denies cough. Denies shortness of breath.  ABDOMEN: Reports good appetite. Denies nausea, vomiting.  EXTREMITIES: Denies leg swelling. Denies focal weakness of the extremities. ENDOCRINE:  Denies heat or cold intolerance.  HEMATOLOGIC: Denies easy bruisability or recent bleeding.  GENITOURINARY:  Denies dysuria or hematuria.  PHYSICAL EXAMINATION: VITAL SIGNS:  Height 73.9 inches, weight 165 pounds, BMI 21.2, temperature 97.9, pulse 74, respirations 13, blood pressure 103/62, pulse ox 98% on room air.  GENERAL: Thin white male in no acute distress.  HEENT: EOMI. Oropharynx is clear. Mucous membranes moist.  NECK: Supple. No appreciable thyromegaly.  CARDIAC: Regular rate and rhythm. No appreciable JVD.  PULMONARY: Clear to auscultation bilaterally. Good inspiratory effort. No wheeze.  ABDOMEN: Diffusely soft, nontender, nondistended. No appreciable hepatosplenomegaly.  EXTREMITIES: No edema is present.  SKIN: No rash or dermatopathy is present. Bilateral barefoot exam is unremarkable.  PSYCHIATRIC: Alert and oriented, calm and cooperative.  NEUROLOGIC:  No focal deficits, alert and oriented.   LABORATORY DATA: Glucose 176, BUN 10, creatinine 1.02. Sodium 134, potassium 2.7, bicarb 18, calcium 7.8, hemoglobin A1c 12.3%.   ASSESSMENT: 1.  Diabetic ketoacidosis, resolved.  2.  Uncontrolled type 1  diabetes, due in part to inadequate insulin administration, specifically with lack of basal insulin.   RECOMMENDATIONS: 1.  Recommend, as his anion gap is now closed and blood sugars are in good range, that we transition him to basal-bolus insulin. A weight-based dosing of approximately 0.5 units/kg per day is equal to about 36 units of insulin, which divided in basal-bolus dosing could be 20 units of Lantus and 6 units of NovoLog at meals, plus an additional sliding scale of 1 unit per 50 over a target of 150.  2.  I will stop his insulin drip 2 hours after his initial dose of Lantus insulin is given.  3.  Recommend a low carbohydrate diet.  4.  Monitor blood sugars at least before meals and at bedtime, we will also add a 2:00 a.m. blood sugar check.  5.  I will follow along with you and arrange for outpatient followup as the patient indicates he no longer wishes to travel to [facility] to his prior [facility]inologist and prefers to see someone locally.   Thank you for the kind request for consultation.  ____________________________ A. Wendall Mola, MD ams:ce D: 05/05/2013 16:29:31 ET T: 05/05/2013 17:10:23 ET JOB#: [id number]  cc: A. Wendall Mola, MD, <Dictator> Macy Mis MD ELECTRONICALLY SIGNED 05/07/2013 17:22

## 2014-08-13 NOTE — H&P (Signed)
PATIENT NAME:  [name], DRAB MR#:  [id number] DATE OF BIRTH:  12/08/1990  DATE OF ADMISSION:  11/27/2013   REFERRING PHYSICIAN:  Kathreen Devoid. Paduchowski, MD  PRIMARY CARE PHYSICIAN: None.   ENDOCRINOLOGIST: Macy Mis, MD  CHIEF COMPLAINT: Hyperglycemia.   HISTORY OF PRESENT ILLNESS: This is a 24 year old male with known history of type 1 diabetes with a few admissions in the past due to DKA, most recently in March of this year. The patient presents with uncontrolled blood sugar as his machine was reading more than 600. The patient reports he has been compliant with insulin, but reports he has been homeless over the last week and his insulin has not been refrigerated since. He is on both Humalog and Lantus; both have not been refrigerated over the last week. The patient reports he has been feeling weak, nauseated, but denies any fevers, chills, productive sputum, cough, dysuria, or polyuria. Upon presentation, the patient appears to be in DKA with a bicarbonate of 9 and anion gap of 26.  He had elevated WBC  at 13.8 as well. The patient was started on insulin drip and hospitalist was requested to admit the patient.   PAST MEDICAL HISTORY:  1.  Type 1 diabetes on Humalog and Lantus.  2.  GERD.  HOME MEDICATIONS:  1.  Omeprazole 20 mg daily.  2.  Levemir 20 units at bedtime.  3.  Humalog sliding scale and 5 units before meals as well.   FAMILY HISTORY: Significant for: Diabetes.   SOCIAL HISTORY: Quit smoking recently. No alcohol. No illicit drug use.   REVIEW OF SYSTEMS:  CONSTITUTIONAL: The patient denies fever or chills. Reports fatigue and weakness.  EYES: Denies blurry vision, double vision, or inflammation.  EARS, NOSE, AND THROAT: Denies, tinnitus, ear pain, hearing loss.  RESPIRATORY: Denies cough, wheezing, hemoptysis, COPD.  CARDIOVASCULAR: Denies chest pain, orthopnea, edema, hypertension.  GASTROINTESTINAL: Denies vomiting, diarrhea, abdominal pain, coffee-ground  emesis. Reports some nausea.  GENITOURINARY: Denies dysuria, hematuria, renal colic. Reports polyuria.  ENDOCRINE: Reports history of type 1 diabetes. No thyroid problem.  MUSCULOSKELETAL: No cramps, arthritis, gout, back pain.  NEUROLOGIC: Denies CVA, TIA, tremors, vertigo.  SKIN: Denies any rash or itching.  PSYCHIATRIC: Denies anxiety, depression, or insomnia.   PHYSICAL EXAMINATION: Temperature 97.6, pulse 90, respiratory rate 14, blood pressure 100/76, saturating 100% on room air.  GENERAL: Thin-appearing male, looks comfortable in bed and not in distress.  HEENT: Head atraumatic, normocephalic. Pupils equal and reactive to light. Pink conjunctivae. Anicteric sclerae. Dry oral mucosa and cracked lips. No bleeding or discharge from his nares.  NECK: Supple. No thyromegaly. No JVD. No lymphadenopathy. No carotid bruits.  CHEST: Good air entry bilaterally. No wheezing, rales, or rhonchi.  CARDIOVASCULAR: S1, S2 heard. No rubs, murmur, or gallops. MPI is nondisplaced.  ABDOMEN: Soft, nontender, nondistended. Bowel sounds present. No rebound. No guarding. No hepatosplenomegaly.  EXTREMITIES: No edema. No clubbing. No cyanosis. Pedal and radial pulses +2 bilaterally.  PSYCHIATRIC: Appropriate affect. Awake, alert x 3. Intact judgment and insight.  NEUROLOGIC: Cranial nerves grossly intact. Motor 5/5. No focal deficits.  MUSCULOSKELETAL: No joint effusion or erythema. No CVA tenderness.  SKIN: Delayed skin turgor. Dry.  LYMPHATIC: No cervical or supraclavicular lymphadenopathy.    PERTINENT LABORATORY DATA: Glucose 387, BUN 23, creatinine 1.32, sodium 132, potassium 3.7, chloride 97, CO2 of 9. ALT 59, AST 26, alkaline phosphatase 172. White blood cells 13.8, hemoglobin 16.1, hematocrit 48.3, platelets 296,000.   ASSESSMENT AND PLAN:  1.  Diabetic ketoacidosis. The patient has been using unrefrigerated insulin over the last week, so most likely his insulin has lost its effectiveness. The  patient will be admitted to the intensive care unit. Will be started on insulin drip and ketoacidosis and hyperglycemia protocol. Will continue with aggressive intravenous fluid hydration. We will repeat  BMP  in a few hours and replace electrolytes as needed. Once his anion gap closes, he will be switched back to his Levemir and Humalog.  2.  Leukocytosis. This is most likely stress related from his diabetic ketoacidosis.  Denies any fever, any chills, any dysuria. Will check urinalysis. He is afebrile.  3.  Deep vein thrombosis prophylaxis. Subcutaneous heparin.  4.  Will request social work consult, given the patient's  homelessness, to see if there is any assistance that can be provided.    TOTAL TIME SPENT ON ADMISSION AND PATIENT CARE: 50 minutes    ____________________________ Starleen Armsawood S. Exie Chrismer, MD dse:ms D: 11/27/2013 06:36:02 ET T: 11/27/2013 06:58:08 ET JOB#: [id number]  cc: Starleen Armsawood S. Hoang Pettingill, MD, <Dictator> Jaythen Hamme Teena IraniS Breah Joa MD ELECTRONICALLY SIGNED 11/28/2013 2:23

## 2014-08-13 NOTE — H&P (Signed)
PATIENT NAME:  [name], Lovell E MR#:  [id number] DATE OF BIRTH:  1991/03/24  DATE OF ADMISSION:  12/20/2013  PRIMARY CARE PHYSICIAN: Wendall MolaMelissa Solum, MD    CHIEF COMPLAINT: Elevated blood sugars and nausea.   HISTORY OF PRESENT ILLNESS:  This is a 24 year old male who presents to the hospital with significantly elevated blood sugars and feeling nauseated over the past day or 2.  The patient apparently is homeless, lives with his mom in her car and apparently has not taken his insulin for the past 2 days, as his insulin mom had his insulin.  The patient was feeling quite nauseated and thirsty this morning.  He was brought to the ER for further evaluation. In the Emergency Room, the patient was noted to be significantly hyperglycemic and noted to be in acute diabetic ketoacidosis.  Hospitalist services were contacted for further treatment and evaluation.  The patient denies any abdominal pain, any diarrhea, any hematuria. Does admit to some increasing thirst and polyuria.  No chest pain or shortness of breath.  No other associated symptoms presently.   REVIEW OF SYSTEMS.  CONSTITUTIONAL: Documented fever. No weight gain or weight loss.  EYES: No blurry or double vision.  ENT: No tinnitus. No postnasal drip. No redness of the oropharynx.  RESPIRATORY: No cough, no wheeze or hemoptysis no dyspnea.  CARDIOVASCULAR: No chest pain, no orthopnea, no palpitations, no syncope.  GASTROINTESTINAL: Positive nausea. No vomiting or diarrhea. No abdominal pain. No melena or hematochezia.  GENITOURINARY: No dysuria or hematuria, positive polyuria and nocturia,  ENDOCRINE: Positive nocturia.  Positive polyuria.  No heat or cold intolerance, positive increased thirst.  INTEGUMENTARY: No rashes no lesions.  HEMATOLOGY: No anemia, no bruising, no bleeding.  MUSCULOSKELETAL: No arthritis, no swelling, no gout.  NEUROLOGIC: No numbness, tingling or ataxia. No seizure-type activity.  PSYCHIATRIC: No anxiety no  insomnia. No ADD.   PAST MEDICAL HISTORY: Consistent with type 1 diabetes, history of previous DKA, and gastroesophageal reflux disease.   ALLERGIES: BENADRYL.   SOCIAL HISTORY:  No smoking. No alcohol abuse. No illicit drug abuse. Lives with his mother, is currently homeless.   FAMILY HISTORY: The patient's father had high blood pressure, diabetes does run in the father's side of the family, mother has a history of DVT, PE.   CURRENT MEDICATIONS:  Humalog sliding scale before meals, 4 times daily, Levemir 20 units at bedtime, omeprazole 20 mg daily as needed.   PHYSICAL EXAMINATION: Presently is as follows:  VITAL SIGNS:  Temperature 98.7, pulse 111, respirations 22, blood pressure 118/78, saturations 100% on room air.  GENERAL: He is a pleasant -appearing male in no apparent distress.  HEENT: Atraumatic, normocephalic. Extraocular muscles are intact. Pupils equal and reactive and to light. Sclerae anicteric. No conjunctival injection. No pharyngeal erythema.  Positive dry oral mucosa. NECK: Supple. There is no jugular venous distention. No bruits, no lymphadenopathy, no thyromegaly.  HEART: Regular rate and rhythm, tachycardic. No murmurs, no rubs, no clicks.  LUNGS: Clear to auscultation bilaterally. No rales, rhonchi, no wheezes.  ABDOMEN: Soft, flat, nontender, nondistended. Has good bowel sounds. No hepatosplenomegaly appreciated.  EXTREMITIES: No evidence of any cyanosis, clubbing or peripheral edema.  Has +2 pedal and radial pulses bilaterally.  NEUROLOGICAL: The patient is alert, awake, and oriented x 3 with no focal motor or sensory deficits bilaterally.  SKIN: Moist and warm with no rashes appreciated.  LYMPHATIC: There is no cervical or axillary lymphadenopathy.     LABORATORY DATA: Serum glucose of 585, BUN  23, creatinine 1.17, sodium 130, potassium 4.5, chloride 95, bicarbonate less than 5.  LFTs showed AST of 46, ALT 123. Albumin of 4.1, total bilirubin 1.1, alkaline  phosphatase of 266.  The patient's white cell count 8.8, hemoglobin 15.7, hematocrit 49.1, platelet count 326. Venous blood gas showed a pH of 6.9, pCO2 of 20.   ASSESSMENT AND PLAN: This is a 24 year old male with a history of type 1 diabetes, gastroesophageal reflux disease history of previous diabetic ketoacidosis who presents to the hospital with elevated blood sugars and feeling nauseated and noted to be in acute diabetic ketoacidosis.  1.  Acute diabetic ketoacidosis.  The exact etiology of this is unclear. The patient says that he is compliant with his insulin and has only missed his last 2 doses of Levemir as his mom had his insulin.   I will start the patient on aggressive IV fluid hydration, place him on an insulin drip per diabetic ketoacidosis protocol, follow serial metabolic profiles. add potassium to his fluids once it falls below 4.5.  Once his anion gap closes, he can be switched over to his long acting Levemir and his sliding scale coverage as mentioned.  I will check a hemoglobin A1c.  We will get an endocrinology consult. The patient is well known to Dr. Tedd Sias.  2.  Abnormal liver function tests. The patient has no abdominal pain. I will check limited abdominal ultrasound. Follow liver function tests.  3.  Hyponatremia. This is likely pseudohyponatremia due to hyperglycemia and should improve as his sugars correct.  4.  Gastroesophageal reflux disease. Continue Protonix. 5.  Acute renal failure. This is likely secondary to dehydration and from the acute diabetic ketoacidosis.  I will give the patient aggressive IV fluids, follow his BUN and creatinine.   CODE STATUS: The patient is a FULL CODE.   Time spent on admission: 50 minutes; critical care time spent is also 50 minutes.     ____________________________ Rolly Pancake. Cherlynn Kaiser, MD vjs:DT D: 12/20/2013 15:23:16 ET T: 12/20/2013 15:47:59 ET JOB#: [id number]  cc: Rolly Pancake. Cherlynn Kaiser, MD, <Dictator> Houston Siren MD ELECTRONICALLY  SIGNED 12/29/2013 15:49

## 2014-08-13 NOTE — H&P (Signed)
PATIENT NAME:  [name], [name] MR#:  [id number] DATE OF BIRTH:  1990-11-06  DATE OF ADMISSION:  05/04/2013  [facility]  HISTORY OF PRESENT ILLNESS:  The patient is a 24 year old Caucasian male with past medical history significant for history of diabetes mellitus type 1, who was on insulin pump in the past; however, he presented to the hospital in the summer last year for insulin pump failure in DKA. Presents back to the hospital with not feeling well. According to the patient as well as the patient's family member, who was present during my interview, he has been having problems with feeling feverish as well as feeling nauseated and vomiting over the past 1 week. He also felt somewhat short of breath and was able to drink only liquids. He thought he was getting somewhat better 2 days ago, however, yesterday as well as today he started noting ketones in his urine and decided to come to Emergency Room for further evaluation. His blood glucose level is above 500. He was also noted to be severely acidotic with bicarbonate level of only 6 and hospitalist services were contacted for admission.   PAST MEDICAL HISTORY: Significant for history of DKA due to insulin pump failure in July 2014. He was discharged on Lantus, however, apparently did not take Lantus at all. He was managing himself just on aspart insulin. Also, during the same admission he had acute renal failure and electrolyte abnormalities. Also hemoglobin A1c which was done in the past apparently was 10.3 in July 2014.   DISCHARGE MEDICATIONS: The patient is just on aspart as needed.   PAST SURGICAL HISTORY: None.   ALLERGIES: No known drug allergies.   SOCIAL HISTORY: The patient has a learning disability. No history of smoking, alcohol or illicit drug abuse. Lives at home with his mother.   FAMILY HISTORY: Diabetes mellitus in the patient's father who is also a  smoker. The patient used to smoke, however, now he quit.   REVIEW OF SYSTEMS: Difficult to obtain as the patient has some problems expressing himself. He admits of having some acid reflux, also feeling fatigued and weak, also feeling feverish, just not getting out of bed, very weak since Tuesday a week ago. He has been also been dyspneic for approximately 1 week, feeling presyncopal, especially whenever he stands up with intermittent nausea and vomiting. Poor p.o. intake, able to drink only liquids as well eat some soup but not much solid food. Denies any high fevers.  EYES:  Denies any blurry vision, double vision, glaucoma or cataracts.  EARS, NOSE, THROAT: Denies any sinus pain, dentures, difficulty swallowing.   RESPIRATORY:  Denies any cough, wheezes, asthma, COPD. CARDIOVASCULAR: Denies chest pains, orthopnea, edema, arrhythmias or palpitations.  GASTROINTESTINAL: Denies any diarrhea, abdominal pains, rectal bleeding, change in bowel habits. Denies any hematemesis.  GENITOURINARY:  Denies dysuria, hematuria, frequency or incontinence. ENDOCRINOLOGY:  Denies any polyuria, nocturia, thyroid prose, heat or cold intolerance.  HEMATOLOGIC AND LYMPHATICS:  Denies any anemia, easy bruising, bleeding or swollen glands.   SKIN: Denies any acne, rashes, change in moles.  MUSCULOSKELETAL: Denies arthritis, cramps, gout.  NEUROLOGIC:  No numbness, epilepsy, tremors.  PSYCHIATRIC: Denies anxiety, insomnia or depression.   PHYSICAL EXAMINATION: VITAL SIGNS: On arrival to the hospital, the patient's temperature was 97, pulse 123, respirations 20, blood pressure 121/77, saturation was 99% on room air.  GENERAL: This is a well-developed, well-nourished, pale Caucasian male in no significant distress, comfortable on  stretcher.  HEENT: Pupils are equal, reactive to light. Extraocular movements intact.  No icterus or conjunctivitis. Has normal hearing. No pharyngeal erythema. Mucosa is very dry, scaling the  lips.  NECK: No masses. Supple, nontender. Thyroid is not enlarged. No adenopathy. No JVD or carotid bruits bilaterally. Full range of motion.  LUNGS: Clear to auscultation in all fields. No rales or rhonchi, diminished breath sounds or wheezing. No labored inspirations, increased effort, dullness to percussion or overt respiratory distress.  CARDIOVASCULAR: S1, S2 present, rhythm is regular.  PMI not lateralized.  Chest is nontender to palpation, 1+ pedal pulses.  EXTREMITIES: No lower extremity edema, calf tenderness or cyanosis was noted.  ABDOMEN: Soft, nontender. Bowel sounds are present. No hepatosplenomegaly or masses were noted.  RECTAL: Deferred.  MUSCLE STRENGTH: Able to move all extremities. No cyanosis, degenerative joint disease or kyphosis. Gait is not tested.  SKIN: No skin rashes, lesions, erythema, nodularity, induration. It was warm and dry to palpation.  LYMPHATIC: No adenopathy in the cervical region.  NEUROLOGICAL: Cranial nerves grossly intact. Sensory is intact. The patient  does have some mild dysarthria and some intermittent confusion. He is cooperative and his memory seems to be good.   LABORATORY, DIAGNOSTIC AND RADIOLOGICAL DATA:  BMP showed a glucose of 145, sodium 128, bicarbonate level was only 6, patient's calcium level was 8.2. Hemoglobin A1c was 12.3. Liver enzymes: Alkaline phosphatase 236. Cardiac enzymes x 1, troponin less than 0.02. White blood cell count was normal at 6.1, hemoglobin was 15.0, platelet count was 339, absolute neutrophil count was normal at 3.9. Urinalysis straw, clear urine, negative for bilirubin, 2+ ketones, more than 500 glucose, specific gravity 1.022, pH was 5.0, 1+ blood, 30 mg/dl of protein, negative for nitrites or leukocyte esterase, less than 1 red blood cell as well as white blood cell, no bacteria or epithelial cells were seen but mucus was present. No radiologic studies were performed. EKG showed sinus tach at 104 beats per minute,  normal axis, no acute ST-T changes were noted.   ASSESSMENT AND PLAN: 1.  Diabetic ketoacidosis. Admit the patient to the medical floor to the Intensive Care Unit. Continue him on insulin IV drip as well as IV fluids. Follow the patient's blood glucose levels every hour and adjust the insulin depending on his needs.  2.  Nausea, vomiting. Will initiate the patient on proton pump inhibitors IV. We will continue IV fluids. Will keep him n.p.o. for now. We will initiate clear liquid diet when his bicarbonate level is around 20.   3.  Hyponatremia: Will continue IV fluids, likely dehydration-related as well as hyperglycemia-related. Will follow the patient's sodium level in the morning.  4.  Diabetes mellitus type 1. The patient's hemoglobin A1c is markedly abnormal, will get  lifestyle intervention as well as dietary followup and care management consultation to help establish primary care physician.   TIME SPENT: 50 minutes on this patient.    ____________________________ Katharina Caper, MD rv:cs D: 05/04/2013 18:29:07 ET T: 05/04/2013 19:18:12 ET JOB#: [id number]  cc: Katharina Caper, MD, <Dictator> [facility] Jihaad Bruschi MD ELECTRONICALLY SIGNED 05/14/2013 14:01

## 2014-08-13 NOTE — Consult Note (Signed)
PATIENT NAME:  [name], [name] MR#:  [id number] DATE OF BIRTH:  March 12, 1991  DATE OF CONSULTATION:  07/01/2013  REFERRING PHYSICIAN:  Enedina Finner, MD CONSULTING PHYSICIAN:  A. Wendall Mola, MD  CHIEF COMPLAINT: DKA.   HISTORY OF PRESENT ILLNESS: This is a 24 year old male with type 1 diabetes admitted early this morning with diabetic ketoacidosis. Presenting blood sugar was over 600. He had a low bicarbonate of 10 and he had elevated urinary ketones consistent with diabetic ketoacidosi. He had a mildly elevated white blood cell count of 12.2. He was started on IV fluids and IV insulin and admitted to the intensive care unit. He has had gradual improvement in his blood sugars and last BG was 190 with an insulin rate of 8.6 units per hour. He has nausea and poor appetite. He denies cough, shortness of breath, fever, dysuria, or hematuria. He does report left shoulder pain and he reports a new rash on the chest and extremities.   He reports he started feeling poorly yesterday with general malaise and nausea. He stopped using his insulin pump 2 days ago when he ran out of pump supplies. He had been started on this pump on May 25, 2013. He claims he has been taking basal insulin of 20 units a day in addition to use of the daily pump. I have no way to confirm this. Per patient, since stopping the pump 2 days ago, he has been taking Levemir 20 units. However, he did not take any yesterday as he forgot before going to work. He claims he has been taking 5 to 10 units of Humalog before meals. It is not clear to me how often he checks blood sugars, however, I suspect this is fairly erratically. He does not follow a low-carb diet. As he was feeling increasingly poor with nausea and vomiting, he presented to the ED for further care.   The patient is well known to me. He was hospitalized in January also for diabetic ketoacidosis. He also had a hospitalization last summer for diabetic ketoacidosis. He had been on  an insulin pump last year, however, it was stopped last summer and just restarted again in February. Last hemoglobin A1c in January was 12.3%. Diabetes is chronically uncontrolled.   PAST MEDICAL HISTORY: 1.  Type 1 diabetes.  2.  Recurrent DKA.  3.  GERD.  MEDICATIONS: 1.  Humalog insulin via insulin pump.  2.  Levemir at 20 units at bedtime.  3.  Omeprazole 20 mg daily.   ALLERGIES: Benadryl causes hyperactivity. No known drug allergies.   FAMILY HISTORY: Grandmother had type 1 diabetes.   SOCIAL HISTORY: The patient lives with his mother. He is employed. He does occasionally drink alcohol.   REVIEW OF SYSTEMS: GENERAL: No weight loss. No fever.  HEENT: No blurred vision. No sore throat.  NECK: No neck pain. No dysphagia.  CARDIAC: No chest pain or palpitation.  PULMONARY: No cough. No shortness of breath. ABDOMEN: He has poor appetite. He has nausea.  EXTREMITIES: He has left shoulder pain.  SKIN: He has a rash on his chest and upper extremities. He denies pruritus. ENDOCRINE: Denies heat or cold intolerance.  HEMATOLOGIC: Denies easy bruisability or recent bleeding.   PHYSICAL EXAMINATION: VITAL SIGNS: Height 68 inches, weight 165 pounds, BMI 24.4. Temperature 97.6, pulse 102, respirations 15, blood pressure 106/53, pulse ox 100% on room air.  GENERAL: Thin white male in no acute distress.  HEENT: EOMI. Oropharynx is clear.  NECK: Supple.  HEART: Tachycardic  without murmur.  LUNGS: Clear to auscultation bilaterally. No wheeze.  ABDOMEN: Diffusely soft, nontender.  EXTREMITIES: No peripheral edema is present.  NEUROLOGIC: Alert, oriented.  PSYCHIATRIC: Calm, cooperative.   DIAGNOSTIC DATA: Laboratory: Glucose 588, BUN 15, creatinine 1.3, sodium 126, potassium 4.5, CO2 10, anion gap 28. WBC 12.2, hematocrit 45.4. Urinalysis showed greater than 500 glucose, 2+ ketones, negative nitrite, negative protein, negative leuk esterase.   ASSESSMENT: A 24 year old male with  diabetic ketoacidosis likely due to inadequate insulin delivery.   RECOMMENDATIONS:  1.  Long-term he would be best managed with restarting his insulin pump with close outpatient follow-up. I did ask his mother today to call his pharmacy to get pump supplies that are needed, overnighted to his home. 2.  Anticipate once his anion gap is closed, he can be converted to subcutaneous insulin. Recommend he be given Levemir 30 units q. 24 hours and NovoLog 6 units t.i.d. before meals in addition to a correction insulin of 1 to 2 units before meals and at bedtime for every 50 that blood sugar is over a target of 150. IV insulin and IV dextrose should be stopped 2 hours after the Levemir is given.  3.  Recommend low carbohydrate diet.  4.  I counseled the patient on the importance of better communication and need for his insulin to be given on a regular basis as prescribed.  5.  I see no evidence of infection and primary team could consider stopping antibiotics if they agree.   Thank you for the kind request for consultation. I will follow along with you.   ____________________________ A. Wendall MolaMelissa Solum, MD ams:sb D: 07/01/2013 13:51:11 ET T: 07/01/2013 14:16:35 ET JOB#: [id number]  cc: A. Wendall MolaMelissa Solum, MD, <Dictator> Macy MisA. MELISSA SOLUM MD ELECTRONICALLY SIGNED 07/10/2013 12:50

## 2014-08-13 NOTE — Consult Note (Signed)
Chief Complaint and History:  Referring Physician Dr. Allena KatzPatel   Chief Complaint DKA   Allergies:  Benadryl: Other  Assessment/Plan:  Assessment/Plan 24 yo M known to me with type 1 diabetes, chronically uncontrolled, admitted with DKA. Initial BG was >600, bicarb 10, 2+ urinary ketones. Started on IVF and IV insulin. Currently on IV insulin rate is 8.6 units/hr. BG now 190. D5 added to IVF. He continues to have N/V/poor appetite. Hemodynamically stable.  Cause of DKA seems to be inadequate insulin delivery. He had been on an insulin pump up until 2 days ago when he ran out of pump supplies. Still has not called his pharm to request refills. Claims to be taking Levemir 20 units daily and Humalog 5-10 units qAC however forgot last night and is typically unreliable in taking his insulin. Unfortunately last clinic appt was cancelled because of the snow.   Pt exmained, interviewed, and chart reviewed.  A/ DKA Type 1 DM, uncontrolled  P/ -Asked family to call and get pump supplies sent asap - needs to get back on insulin pump. Should not be taking basal insulin while on the pump. -Will give nursing directions on conversion to SQ insulin once AG has closed -Agree with D5 now, stop this when IV insulin is stopped -Follow 'lytes closely as he is at risk for hypokalemia -Will arrange close out-pt follow-up on discharge -As there are no s/s of infection and no lab e/o infection, there does not seem to be a role for antibiotics, however I will defer to primary team.  Full consult to be dictated.   Electronic Signatures: Raj JanusSolum, Shakeita Vandevander M (MD)  (Signed 12-Mar-15 14:02)  Authored: Chief Complaint and History, ALLERGIES, HOME MEDICATIONS, Assessment/Plan   Last Updated: 12-Mar-15 14:02 by Raj JanusSolum, Eileene Kisling M (MD)

## 2014-08-13 NOTE — Discharge Summary (Signed)
PATIENT NAME:  Wayne RocheSIMMONS, Trindon E MR#:  [id number] DATE OF BIRTH:  22-Jul-1990  DATE OF ADMISSION:  05/04/2013 DATE OF DISCHARGE:  05/06/2013  ADMISSION DIAGNOSIS: Diabetic ketoacidosis.   DISCHARGE DIAGNOSES:  1. Diabetic ketoacidosis secondary to acute viral illness.  2. Hypokalemia from diabetic ketoacidosis.  3. Hyponatremia from diabetic ketoacidosis resolved.   CONSULTATIONS: Dr. Tedd SiasSolum.   DISCHARGE LABORATORY DATA: Sodium 139, potassium 2.9, chloride 111, bicarbonate 22, BUN 7, creatinine 0.81, glucose 82, magnesium 1.9.   HOSPITAL COURSE: A 24 year old male who presented with nausea, vomiting, found to have  severe DKA secondary to a viral infection. For further details, please refer to the H and P.  1. DKA. The patient was admitted to CCU under DKA CCU protocol. He was placed on insulin drip. Dr. Tedd SiasSolum was consulted for further evaluation and treatment. The patient is now out of DKA per the protocol. She recommended a Levemir Lantus 20 units, which he will be on and his  sliding scale insulin.  2. Hypokalemia from DKA. He did have his potassium repleted prior to discharge. He will be also discharged with potassium supplements. His magnesium was normal.  3. Hyponatremia secondary to DKA, which resolved.  4. GERD. The patient will continue on his PPI.   DISCHARGE MEDICATIONS: 1. Omeprazole 20 mg daily.  2. KCl 20 mEq 2 tablets b.i.d. x 4 days.  3. Levemir 20 units in the evening.  4. Humalog sliding scale 6 units before meals.   DISCHARGE DIET: ADA diet.   DISCHARGE ACTIVITY: As tolerated.   DISCHARGE FOLLOW-UP: The patient will follow up with Dr. Tedd SiasSolum on 05/19/2013.   TIME SPENT: 35 minutes.  The patient is medically stable for discharge.   ____________________________ Blanche Gallien P. Juliene PinaMody, MD spm:sg D: 05/06/2013 12:14:40 ET T: 05/06/2013 12:47:06 ET JOB#: [id number]  cc: Keisi Eckford P. Juliene PinaMody, MD, <Dictator> A. Wendall MolaMelissa Solum, MD  Janyth ContesSITAL P Dulse Rutan MD ELECTRONICALLY SIGNED 05/06/2013  19:28

## 2014-08-13 NOTE — Discharge Summary (Signed)
PATIENT NAME:  [name], [name] MR#:  [id number] DATE OF BIRTH:  1990/07/11  DATE OF ADMISSION:  11/27/2013 DATE OF DISCHARGE:    PRIMARY CARE PHYSICIAN: At the [facility].    FINAL DIAGNOSES: 1.  Diabetic ketoacidosis.  2.  Gastroesophageal reflux disease.  3.  Malnutrition.   MEDICATIONS ON DISCHARGE: Include omeprazole 20 mg 1 capsule daily, Humulin R sliding scale, Levemir insulin 19 units subcutaneous injection daily.   DIET: Low-sodium diet, regular consistency.   ACTIVITY: As tolerated.   FOLLOWUP: In 1 to 2 weeks at the [facility].   HOSPITAL COURSE: The patient was admitted 11/27/2013, discharged same day. The patient came in with hyperglycemia, was admitted with diabetic ketoacidosis. The patient has been homeless for the last week and living in his mother's car. His insulin has not been refrigerated and likely the cause of the ineffective insulin. The patient was admitted with DKA.   LABORATORY AND RADIOLOGICAL DATA: Showed initial blood sugar of 427. The chemistry glucose was 387, BUN 23, creatinine 1.32, sodium 132, potassium 3.7, chloride 97, CO2 of 9, alkaline phosphatase 172, ALT 59, AST 26, anion gap 26. White blood cell count 13.8, hemoglobin and hematocrit 16.1 and 48.3, platelet count of 296,000. Chest x-ray: No active cardiopulmonary disease. Urinalysis, 2+ ketones, greater than 500 mg/dL of glucose, nitrites and leukocyte esterase negative. On discharge, glucose 114, BUN 17, creatinine 1.34, sodium 138, potassium 3.5, chloride 110, CO2 of 19. Anion gap is now 9.   HOSPITAL COURSE PER PROBLEM LIST:  1.  For the patient's diabetic ketoacidosis, likely ineffective insulin from being in the heat. I had the care manager give the patient insulin from the pharmacy. I told them that it has to be refrigerated whether they use the grandmother's refrigerator and have to go over there to inject insulin, this must be done while they are living in their car.  2.   Gastroesophageal reflux disease, on omeprazole.  3.  Malnutrition as documented by the dietitian.    TIME SPENT ON DISCHARGE: 35 minutes.    ____________________________ Herschell Dimesichard J. Renae GlossWieting, MD rjw:at D: 11/27/2013 15:30:30 ET T: 11/27/2013 15:54:39 ET JOB#: [id number]  cc: Herschell Dimesichard J. Renae GlossWieting, MD, <Dictator> [facility] Salley ScarletICHARD J Lanissa Cashen MD ELECTRONICALLY SIGNED 11/29/2013 11:49

## 2014-08-13 NOTE — H&P (Signed)
PATIENT NAME:  Wayne, Grant MR#:  [id number] DATE OF BIRTH:  [birth year]  DATE OF ADMISSION:  07/01/2013  PRIMARY CARE PHYSICIAN: None.  ENDOCRINOLOGIST: Dr. Gabriel Carina  CHIEF COMPLAINT: I have ran out of insulin cartridges for the insulin pump.  HISTORY OF PRESENT ILLNESS: [name] is 24 year old Caucasian gentleman with history of type 1 diabetes who was recently admitted 13th January and discharged 15th January who presents to the Emergency Room with his family members who report the patient ran out of his insulin pump cartridges. The patient takes regular insulin via insulin pump and he is on insulin detemir 20 units at bedtime. The patient started having some vomiting, felt very thirsty, and had excessive urination. He felt very weak and came to the Emergency Room where he was found in DKA. Aggressive IV hydration and insulin drip were started. The patient is being admitted with DKA.   PAST MEDICAL HISTORY: Type 1 diabetes, on insulin pump and Levemir at bedtime.   PAST SURGICAL HISTORY: GERD.  MEDICATIONS: 1.  Omeprazole 20 mg once a day.  2.  Levemir 20 units at bedtime.  3.  Humalog sliding scale and insulin pump.  FAMILY HISTORY: Positive for diabetes.   SOCIAL HISTORY: Smokes every day. Drinks alcohol socially. Denies any drug use.   REVIEW OF SYSTEMS:  CONSTITUTIONAL: Positive for fatigue and weakness. No fever.  EYES: No blurred or double vision.  ENT: No tinnitus, ear pain, hearing loss, or sinusitis.  RESPIRATORY: No cough, wheeze, hemoptysis, or COPD. CARDIOVASCULAR: No chest pain, orthopnea, edema, or hypertension. GASTROINTESTINAL: Positive for some nausea. No abdominal pain, vomiting, or melena. No abdominal mass. GENITOURINARY: Positive for polyuria. Denies dysuria, frequency.  ENDOCRINE: Positive for type 1 diabetes. No thyroid problems.  MUSCULOSKELETAL: No arthritis.  NEUROLOGIC: No CVA, TIA, tremors, or vertigo.  SKIN: Positive for pustular acne-like  lesions over the chest and the back. PSYCHIATRIC: Positive for mild anxiety. No bipolar disorder or depression. All other systems reviewed and negative.   PHYSICAL EXAMINATION: GENERAL: The patient is awake, alert, and oriented x3, not in acute distress.  VITAL SIGNS: Afebrile. Pulse is 108, blood pressure 116/66, sats 100% on room air.  HEENT: Atraumatic, normocephalic. Pupils are equal, round and reactive to light and accommodation. EOM intact. Oral mucosa is dry.  NECK: Supple. No JVD. No carotid bruit.  LUNGS: Clear to auscultation bilaterally. No rales, rhonchi, respiratory distress, or labored breathing.  HEART: Tachycardia present. Both the heart sounds are normal. Rate is tachycardic. Rhythm is normal. No murmur heard. PMI not lateralized. Chest is nontender.  EXTREMITIES: Good pedal pulses. Good femoral pulses. No lower extremity edema.  ABDOMEN: Soft, benign, nontender. No organomegaly. Positive bowel sounds.  NEUROLOGIC: Grossly intact cranial nerves II through XII. No motor or sensory deficit.  PSYCH: The patient is awake, alert, and oriented x3.   LABORATORY DATA: UA negative for UTI. Glucose 588, BUN 15, creatinine 1.37, sodium 126, potassium 4.5, chloride 88, bicarb 10, anion gap 28. White count 12, H and H 15.8 and 45. PH is 7.10 and pCO2 is 34.   ASSESSMENT AND PLAN: 24 year old [name] with a history of type 1 diabetes who comes in with:  1.  Diabetic ketoacidosis. The patient missed getting his insulin pump supplies and hence has not been on insulin pump for the last 2 days. We will place him in the intensive care unit on the diabetic ketoacidosis/hyperglycemia protocol with insulin drip and aggressive IV hydration. Follow-up MET-B. Endocrine consultation with Dr. Gabriel Carina  appreciated. The patient will be switched back to insulin pump and detemir per Dr. Joycie Peek recommendation.  2.  Nausea and vomiting secondary to diabetic ketoacidosis, stable. We will give IV fluids,  start on clear liquids and advance as tolerated.  3.  Acute renal failure secondary to dehydration from diabetic ketoacidosis. Continue IV hydration and monitor MET-B. 4.  Acne-like rash over the torso. We will give p.o. clindamycin t.i.d.  5.  Type 1 diabetes. Per Dr. Joycie Peek recommendation.  6.  Deep vein thrombosis prophylaxis. Subcutaneous heparin.   Further work-up according to patient's hospital course. Hospital admission plan was discussed with the patient and the patient's family members.   The patient was advised on smoking cessation, about 3 minutes spent. The patient is not motivated at this time.   CRITICAL TIME SPENT: 60 minutes.   ____________________________ Hart Rochester Posey Pronto, MD sap:sb D: 07/01/2013 14:27:35 ET T: 07/01/2013 15:30:10 ET JOB#: [id number]  cc: Flecia Shutter A. Posey Pronto, MD, <Dictator> A. Lavone Orn, MD Ilda Basset MD ELECTRONICALLY SIGNED 07/09/2013 10:42

## 2014-08-21 NOTE — H&P (Signed)
PATIENT NAME:  [name], [name] MR#:  [id number] DATE OF BIRTH:  [birth year]  DATE OF ADMISSION:  05/13/2014  PRIMARY CARE PHYSICIAN:  Will be Dr. Tawni LevyWilliam Heard at [facility].   CHIEF COMPLAINT: Lethargic, unresponsiveness.   HISTORY OF PRESENT ILLNESS:  History is obtained from the patient's mother present in the Emergency Room. [name] is a 24 year old Caucasian gentleman well known to our service from previous multiple admissions due to his diabetic ketoacidosis, last admission was in September of 2015, comes in today after his mother found him unresponsive, lethargic, and had several episodes of vomiting at home. Mother returned from work, was in third shift, found the patient lethargic, and brought him to the Emergency Room, was found to be severely acidotic and in DKA.  He was significantly dehydrated.  He is being admitted for further evaluation and management. The patient is getting wide-open fluids along with IV insulin drip.   According to the patient's mother he ran out of his insulin about 2-3 days ago, several attempts were made to call endocrinology Dr. Pricilla HandlerSolum's office, for some reason the patient could not get refill on his insulin. He also has been set up with primary care physician, [place]. Marthe [place]ard in [facility] [place] to follow up for his diabetes.   PAST MEDICAL HISTORY:  1.  Type 1 diabetes.  2.  GERD.    ALLERGIES: BENADRYL.   SOCIAL HISTORY: Nonsmoker, nonalcoholic. Lives with his mother. Currently works at [employer].   FAMILY HISTORY: The patient's father had high blood pressure and diabetes runs on father's side of the family. Mother has history of DVT and PE.   MEDICATIONS:  1. Levemir 14 units at bedtime.  2. Humalog 3 units b.i.d.  3. Omeprazole 20 mg p.o. daily as needed.  4. Advil 200 mg as needed  REVIEW OF SYSTEMS: Unobtainable. The patient is very lethargic.   PHYSICAL EXAMINATION:  GENERAL: The patient is very lethargic.  VITAL SIGNS:  His temperature  was 94.5 initially, current temperature is 96.5, pulse is 124, blood pressure is 110/72, saturation is 100% on room air.  HEENT: Appears critically ill. Oral mucosa is severely dry, poor dental hygiene.  NECK:  Supple.  No JVD. No carotid bruit.  LUNGS: Clear to auscultation bilaterally. No rales, rhonchi, respiratory distress, or labored breathing.  CARDIOVASCULAR: Tachycardia present. No murmur heard. PMI not lateralized. Chest nontender.  EXTREMITIES: Good pedal pulses, good femoral pulses. No lower extremity edema.  ABDOMEN: Soft, benign, nontender. No organomegaly.  SKIN: Warm and dry. No rash seen.  NEUROLOGIC:  The patient is lethargic, although he moves all his extremities well. No focal deficit.  PSYCHIATRIC: The patient is lethargic, unable to assess.   LABORATORY DATA: UA negative for UTI. PH is 6.9, pCO2 is 21. Glucose is 898, BUN is 33, creatinine is 1.37, sodium is 129, potassium is 4.7, chloride is 87, bicarbonate less than 5. White count is 22.2, H and H are 15.2 and 51.3. Platelet count is 315,000.   ASSESSMENT: A 24 year old, [name], with history of type 1 diabetes gastroesophageal reflux disease, comes in with elevated sugars and unresponsiveness found to have:    1.  Acute diabetic ketoacidosis. Exact etiology unclear, however the etiology likely is the patient ran out of his insulin, unable to get a refill. We will admit him in CCU, aggressive IV fluid hydration, place him on insulin drip per DKA protocol. Follow serial metabolic profile, add potassium to his fluid once it falls below 4.5. The  patient had seen Dr. Tedd Sias in the past. 2.  Hyponatremia, appears likely pseudohyponatremia due to hyperglycemia and should improve when sugars correct.  3.  Gastroesophageal reflux disease. Continue Protonix.  4.  Clinical dehydration likely due to severe acidosis.  Give aggressive IV fluids. Monitor Is and Os.   5.  Acute encephalopathy suspected due to severe acidosis from  diabetic ketoacidosis.  6.  Deep vein thrombosis prophylaxis. Subcutaneous heparin t.i.d.   CODE STATUS: Full code.   CRITICAL TIME SPENT: 50 minutes.      ____________________________ Wylie Hail Allena Katz, Grant sap:bu D: 05/13/2014 17:31:20 ET T: 05/13/2014 19:12:44 ET JOB#: [id number]  cc: Darika Ildefonso A. Allena Katz, Grant, <Dictator> Dr. Tawni Levy [name] ELECTRONICALLY SIGNED 05/17/2014 14:21

## 2014-08-21 NOTE — Discharge Summary (Signed)
PATIENT NAME:  [name], [name] MR#:  [id number] DATE OF BIRTH:  1991/02/08  DATE OF ADMISSION:  05/13/2014 DATE OF DISCHARGE: 05/16/2014   ADMITTING DIAGNOSIS: Diabetic ketoacidosis.  DISCHARGE DIAGNOSES:  1.  Diabetes mellitus type 1 with diabetic ketoacidosis and no coma. Hemoglobin A1c of 11.9.  2.  Metabolic encephalopathy due to diabetic ketoacidosis, resolved. 3.  Dehydration due to diabetic ketoacidosis.  4.  Acute renal insufficiency.  5.  Leukocytosis.  6.  Hyponatremia.  7.  Hypokalemia.  8.  Acute gastritis.  9.  Nonsteroidal anti-inflammatory medication use.   DISCHARGE CONDITION: Stable.   DISCHARGE MEDICATIONS: The patient is to continue Humalog 3 units subcutaneously 3 times daily before meals, Levemir 14 units subcutaneously at bedtime, omeprazole 20 mg p.o. daily as needed, sucralfate 1 gram 4 times daily, and Tylenol 325 mg 2 tablets every 4 hours as needed.   The patient was advised not to use any Advil.   HOME OXYGEN: None.   DIET: Two gram salt, low-fat, low-cholesterol, carbohydrate-controlled diet, mechanical soft; advance to regular as tolerated.    FOLLOWUP APPOINTMENT: With Dr. Tedd Sias in 2 days after discharge; Dr. Greggory Stallion in 2 days after discharge.  CONSULTANTS: Care management, social work.   RADIOLOGIC STUDIES: Chest x-ray, PA and lateral, 05/15/2014 showed slight blunting of both posterior costophrenic angles, potentially representing small bilateral pleural effusions, otherwise no significant abnormalities were observed.  HOSPITAL COURSE: The patient is a 24 year old Caucasian male with history of diabetes mellitus type 1 who presents to the hospital on 05/13/2014 with lethargy as well as unresponsiveness. Please refer to Dr. Jearl Klinefelter Patel's admission note on 05/13/2014. On arrival to the hospital, his temperature was 94.5, initially pulse was 124, blood pressure 110/72, saturation was 1000% on room air. Physical examination revealed very dry oral mucosa,  critical ill patient who was lethargic, otherwise, very difficult to assess. The patient's lab data on arrival to the hospital revealed elevated glucose level of 898, BUN and creatinine were 33 and 1.37, sodium 129, otherwise BMP was unremarkable. The patient's bicarbonate level was less than 5, calcium level was also 8.2. Liver enzymes were remarkable for alkaline phosphatase 264, otherwise unremarkable. White blood cell count was elevated to 22.2 thousand, hemoglobin was 15.2, platelet count was 315,000. Absolute neutrophil count was not checked,  MCV was high at 105. Urinalysis was unremarkable. The patient's pH of venous blood was 6.9, pCO2 was 21. EKG showed sinus tachycardia at 124 beats per minute, biatrial enlargement, but no significant change since prior EKG. The patient was admitted to the hospital for further evaluation. He was initiated on IV insulin drip as well as high rate IV fluids. He was rehydrated and his insulin was supplemented. With this, his condition improved. However, still he was having continuous intermittent nausea and was not able to tolerate liquids. He was given PPIs as well as Carafate with which his condition improved. On 05/16/2014, he was able to eat a soft diet and his labs looked good with normal BMP, normal creatinine level of 0.81. His glucose level was ranging between 71 and 188. The patient was advised to resume his usual doses of insulin. His hemoglobin A1c was checked and was found to be 11.9. It was felt that the patient would benefit from advancement of his medications. However, since his oral intake is still poor, we felt that it would be best for him to continue these doses at present. He is to follow up with his primary physician, Dr. Greggory Stallion, as well as  his endocrinologist, Dr. Tedd SiasSolum, in the next few days after discharge and make decisions about advancement of his insulin doses as he is able to.   In regard to metabolic encephalopathy, it was felt that the patient's  metabolic encephalopathy was due to DKA. The patient's condition improved with resolution of his DKA. In regard to dehydration, the patient's dehydration also resolved with IV fluid administration. In regard to acute renal insufficiency, the patient's kidney function normalized as time progressed. For leukocytosis, the patient's white blood cell count normalized and was felt to be stress related. Hyponatremia as well as hypokalemia improved. The patient's acute gastritis was felt to be due to bowel disease or some unspecified agent versus nonsteroidal anti-inflammatory use induced. The patient was advised not to continue these medications and take Tylenol if he needs to and continue PPIs as well as Carafate. He is being discharged in stable condition with the above-mentioned medications and followup.   On the day of discharge, temperature was 97.8, pulse was 70, respiratory rate was 19, blood pressure systolic 109 to 123, diastolic 70s to 80s. Oxygen saturation was 99% to 100% on room air at rest.   TIME SPENT: Forty minutes with the patient.    ____________________________ Katharina Caperima Atasha Colebank, MD rv:TT D: 05/16/2014 17:07:27 ET T: 05/16/2014 17:50:13 ET JOB#: [id number]  cc: Katharina Caperima Florabelle Cardin, MD, <Dictator> A. Wendall MolaMelissa Solum, MD Earna CoderAaron M. Greggory StallionHurd, MD Katharina CaperIMA Anyelin Mogle MD ELECTRONICALLY SIGNED 05/29/2014 15:42

## 2014-10-04 ENCOUNTER — Inpatient Hospital Stay
Admission: EM | Admit: 2014-10-04 | Discharge: 2014-10-06 | DRG: 638 | Disposition: A | Discharge status: Home / Self Care | Payer: BLUE CROSS/BLUE SHIELD | Attending: Internal Medicine | Admitting: Internal Medicine

## 2014-10-04 ENCOUNTER — Encounter: Payer: Self-pay | Admitting: Medical Oncology

## 2014-10-04 DIAGNOSIS — E1010 Type 1 diabetes mellitus with ketoacidosis without coma: Principal | ICD-10-CM | POA: Diagnosis present

## 2014-10-04 DIAGNOSIS — E876 Hypokalemia: Secondary | ICD-10-CM | POA: Diagnosis present

## 2014-10-04 DIAGNOSIS — K219 Gastro-esophageal reflux disease without esophagitis: Secondary | ICD-10-CM | POA: Diagnosis present

## 2014-10-04 DIAGNOSIS — E860 Dehydration: Secondary | ICD-10-CM | POA: Diagnosis present

## 2014-10-04 DIAGNOSIS — Z794 Long term (current) use of insulin: Secondary | ICD-10-CM | POA: Diagnosis not present

## 2014-10-04 DIAGNOSIS — Z87891 Personal history of nicotine dependence: Secondary | ICD-10-CM

## 2014-10-04 DIAGNOSIS — Z888 Allergy status to other drugs, medicaments and biological substances status: Secondary | ICD-10-CM | POA: Diagnosis not present

## 2014-10-04 DIAGNOSIS — E10649 Type 1 diabetes mellitus with hypoglycemia without coma: Secondary | ICD-10-CM | POA: Diagnosis present

## 2014-10-04 DIAGNOSIS — D72829 Elevated white blood cell count, unspecified: Secondary | ICD-10-CM | POA: Diagnosis present

## 2014-10-04 DIAGNOSIS — N179 Acute kidney failure, unspecified: Secondary | ICD-10-CM | POA: Diagnosis present

## 2014-10-04 DIAGNOSIS — R339 Retention of urine, unspecified: Secondary | ICD-10-CM | POA: Diagnosis present

## 2014-10-04 DIAGNOSIS — E1110 Type 2 diabetes mellitus with ketoacidosis without coma: Secondary | ICD-10-CM | POA: Diagnosis present

## 2014-10-04 HISTORY — DX: Type 2 diabetes mellitus without complications: E11.9

## 2014-10-04 HISTORY — DX: Type 1 diabetes mellitus with ketoacidosis without coma: E10.10

## 2014-10-04 LAB — BASIC METABOLIC PANEL
ANION GAP: 15 (ref 5–15)
Anion gap: 13 (ref 5–15)
BUN: 20 mg/dL (ref 6–20)
BUN: 19 mg/dL (ref 6–20)
CHLORIDE: 109 mmol/L (ref 101–111)
CO2: 14 mmol/L — AB (ref 22–32)
CO2: 15 mmol/L — AB (ref 22–32)
CREATININE: 1.01 mg/dL (ref 0.61–1.24)
Calcium: 8.6 mg/dL — ABNORMAL LOW (ref 8.9–10.3)
Calcium: 8.2 mg/dL — ABNORMAL LOW (ref 8.9–10.3)
Chloride: 110 mmol/L (ref 101–111)
Creatinine, Ser: 1.07 mg/dL (ref 0.61–1.24)
GFR calc Af Amer: >60 mL/min (ref >60)
GFR calc Af Amer: >60 mL/min (ref >60)
GFR calc non Af Amer: >60 mL/min (ref >60)
GLUCOSE: 135 mg/dL — AB (ref 65–99)
Glucose, Bld: 230 mg/dL — ABNORMAL HIGH (ref 65–99)
Potassium: 4.1 mmol/L (ref 3.5–5.1)
Potassium: 3.8 mmol/L (ref 3.5–5.1)
SODIUM: 139 mmol/L (ref 135–145)
Sodium: 137 mmol/L (ref 135–145)

## 2014-10-04 LAB — CBC WITH DIFFERENTIAL/PLATELET
Basophils Absolute: 0 10*3/uL (ref 0–0.1)
Basophils Relative: 0 %
EOS PCT: 1 %
Eosinophils Absolute: 0.1 10*3/uL (ref 0–0.7)
HEMATOCRIT: 56.1 % — AB (ref 40.0–52.0)
HEMOGLOBIN: 18 g/dL (ref 13.0–18.0)
LYMPHS ABS: 1 10*3/uL (ref 1.0–3.6)
Lymphocytes Relative: 7 %
MCH: 28.8 pg (ref 26.0–34.0)
MCHC: 32 g/dL (ref 32.0–36.0)
MCV: 90 fL (ref 80.0–100.0)
Monocytes Absolute: 0.5 10*3/uL (ref 0.2–1.0)
Monocytes Relative: 3 %
NEUTROS PCT: 89 %
Neutro Abs: 13.6 10*3/uL — ABNORMAL HIGH (ref 1.4–6.5)
Platelets: 289 10*3/uL (ref 150–440)
RBC: 6.24 MIL/uL — ABNORMAL HIGH (ref 4.40–5.90)
RDW: 13.8 % (ref 11.5–14.5)
WBC: 15.2 10*3/uL — ABNORMAL HIGH (ref 3.8–10.6)

## 2014-10-04 LAB — GLUCOSE, CAPILLARY
GLUCOSE-CAPILLARY: 296 mg/dL — AB (ref 65–99)
GLUCOSE-CAPILLARY: 136 mg/dL — AB (ref 65–99)
GLUCOSE-CAPILLARY: 145 mg/dL — AB (ref 65–99)
GLUCOSE-CAPILLARY: 123 mg/dL — AB (ref 65–99)
GLUCOSE-CAPILLARY: 165 mg/dL — AB (ref 65–99)
GLUCOSE-CAPILLARY: 158 mg/dL — AB (ref 65–99)
GLUCOSE-CAPILLARY: 171 mg/dL — AB (ref 65–99)
Glucose-Capillary: 426 mg/dL — ABNORMAL HIGH (ref 65–99)
Glucose-Capillary: 385 mg/dL — ABNORMAL HIGH (ref 65–99)
Glucose-Capillary: 239 mg/dL — ABNORMAL HIGH (ref 65–99)
Glucose-Capillary: 170 mg/dL — ABNORMAL HIGH (ref 65–99)
Glucose-Capillary: 126 mg/dL — ABNORMAL HIGH (ref 65–99)
Glucose-Capillary: 190 mg/dL — ABNORMAL HIGH (ref 65–99)
Glucose-Capillary: 218 mg/dL — ABNORMAL HIGH (ref 65–99)
Glucose-Capillary: 228 mg/dL — ABNORMAL HIGH (ref 65–99)
Glucose-Capillary: 155 mg/dL — ABNORMAL HIGH (ref 65–99)

## 2014-10-04 LAB — URINALYSIS COMPLETE WITH MICROSCOPIC (ARMC ONLY)
BILIRUBIN URINE: NEGATIVE
Bacteria, UA: NONE SEEN
Leukocytes, UA: NEGATIVE
NITRITE: NEGATIVE
Protein, ur: 100 mg/dL — AB
Specific Gravity, Urine: 1.026 (ref 1.005–1.030)
pH: 5 (ref 5.0–8.0)

## 2014-10-04 LAB — BLOOD GAS, ARTERIAL
ALLENS TEST (PASS/FAIL): POSITIVE — AB
FIO2: 21 %
PATIENT TEMPERATURE: 37
PO2 ART: 120 mmHg — AB (ref 83.0–108.0)
pCO2 arterial: 18 mmHg — CL (ref 32.0–48.0)
pH, Arterial: 7.19 — CL (ref 7.350–7.450)

## 2014-10-04 LAB — COMPREHENSIVE METABOLIC PANEL
ALT: 15 U/L — ABNORMAL LOW (ref 17–63)
ANION GAP: 29 — AB (ref 5–15)
AST: 19 U/L (ref 15–41)
Albumin: 4.8 g/dL (ref 3.5–5.0)
Alkaline Phosphatase: 185 U/L — ABNORMAL HIGH (ref 38–126)
BUN: 22 mg/dL — AB (ref 6–20)
CHLORIDE: 99 mmol/L — AB (ref 101–111)
CO2: 9 mmol/L — AB (ref 22–32)
CREATININE: 1.78 mg/dL — AB (ref 0.61–1.24)
Calcium: 9.6 mg/dL (ref 8.9–10.3)
GFR calc Af Amer: >60 mL/min (ref >60)
GFR, EST NON AFRICAN AMERICAN: 52 mL/min — AB (ref >60)
Glucose, Bld: 447 mg/dL — ABNORMAL HIGH (ref 65–99)
Potassium: 5.2 mmol/L — ABNORMAL HIGH (ref 3.5–5.1)
Sodium: 137 mmol/L (ref 135–145)
Total Bilirubin: 1.4 mg/dL — ABNORMAL HIGH (ref 0.3–1.2)
Total Protein: 8.5 g/dL — ABNORMAL HIGH (ref 6.5–8.1)

## 2014-10-04 LAB — BETA-HYDROXYBUTYRIC ACID: Beta-Hydroxybutyric Acid: >8 mmol/L — ABNORMAL HIGH (ref 0.05–0.27)

## 2014-10-04 LAB — HEMOGLOBIN A1C: HEMOGLOBIN A1C: 11.4 % — AB (ref 4.0–6.0)

## 2014-10-04 MED ORDER — ACETAMINOPHEN 650 MG RE SUPP: 650.0000 mg | Freq: Four times a day (QID) | RECTAL | Status: DC | PRN

## 2014-10-04 MED ORDER — PANTOPRAZOLE SODIUM 40 MG PO TBEC
40.0000 mg | DELAYED_RELEASE_TABLET | Freq: Every day | ORAL | Status: DC
  Administered 2014-10-04 – 2014-10-06 (×3): 40 mg via ORAL
  Filled 2014-10-04 (×3): qty 1

## 2014-10-04 MED ORDER — SODIUM CHLORIDE 0.9 % IV SOLN
INTRAVENOUS | Status: DC
  Administered 2014-10-04: 3.3 [IU]/h via INTRAVENOUS
  Filled 2014-10-04: qty 2.5

## 2014-10-04 MED ORDER — HEPARIN SODIUM (PORCINE) 5000 UNIT/ML IJ SOLN: 5000.0000 [IU] | Freq: Three times a day (TID) | INTRAMUSCULAR | Status: DC

## 2014-10-04 MED ORDER — ONDANSETRON HCL 4 MG/2ML IJ SOLN
4.0000 mg | Freq: Four times a day (QID) | INTRAMUSCULAR | Status: DC | PRN
  Administered 2014-10-04: 4 mg via INTRAVENOUS
  Filled 2014-10-04: qty 2

## 2014-10-04 MED ORDER — SODIUM CHLORIDE 0.9 % IV SOLN
INTRAVENOUS | Status: AC
  Administered 2014-10-04: 13:00:00 via INTRAVENOUS

## 2014-10-04 MED ORDER — SODIUM CHLORIDE 0.9 % IV SOLN
INTRAVENOUS | Status: DC
  Filled 2014-10-04: qty 2.5

## 2014-10-04 MED ORDER — ONDANSETRON HCL 4 MG PO TABS: 4.0000 mg | ORAL_TABLET | Freq: Four times a day (QID) | ORAL | Status: DC | PRN

## 2014-10-04 MED ORDER — SODIUM CHLORIDE 0.9 % IV SOLN
INTRAVENOUS | Status: DC
  Administered 2014-10-04 – 2014-10-05 (×2): via INTRAVENOUS

## 2014-10-04 MED ORDER — ACETAMINOPHEN 325 MG PO TABS
650.0000 mg | ORAL_TABLET | Freq: Four times a day (QID) | ORAL | Status: DC | PRN
Start: 2014-10-04 — End: 2014-10-06

## 2014-10-04 MED ORDER — DEXTROSE-NACL 5-0.45 % IV SOLN
INTRAVENOUS | Status: DC
  Administered 2014-10-04: 14:00:00 via INTRAVENOUS

## 2014-10-04 MED ORDER — OMEPRAZOLE MAGNESIUM 20 MG PO TBEC: 20.0000 mg | DELAYED_RELEASE_TABLET | Freq: Every day | ORAL | Status: DC

## 2014-10-04 MED ORDER — ALBUTEROL SULFATE (2.5 MG/3ML) 0.083% IN NEBU: 2.5000 mg | INHALATION_SOLUTION | RESPIRATORY_TRACT | Status: DC | PRN

## 2014-10-04 MED ORDER — HEPARIN SODIUM (PORCINE) 5000 UNIT/ML IJ SOLN
5000.0000 [IU] | Freq: Three times a day (TID) | INTRAMUSCULAR | Status: DC
  Administered 2014-10-04 – 2014-10-05 (×3): 5000 [IU] via SUBCUTANEOUS
  Filled 2014-10-04 (×4): qty 1

## 2014-10-04 NOTE — Progress Notes (Signed)
Inpatient Diabetes Program Recommendations  AACE/ADA: New Consensus Statement on Inpatient Glycemic Control (2013)  Target Ranges:  Prepandial:   less than 140 mg/dL      Peak postprandial:   less than 180 mg/dL (1-2 hours)      Critically ill patients:  140 - 180 mg/dL   Results for [name], [name] (MRN [id number]) as of 10/04/2014 14:17  Ref. Range 10/04/2014 08:46 10/04/2014 10:15 10/04/2014 11:31 10/04/2014 12:28 10/04/2014 13:32  Glucose-Capillary Latest Ref Range: 65-99 mg/dL 456 (H) 256 (H) 389 (H) 239 (H) 170 (H)   Reason for Visit: on IV insulin   Diabetes history: Type 1 diabetes Outpatient Diabetes medications: Lantus 15 units q day, Novolog based on a sliding scale Current orders for Inpatient glycemic control: IV insulin    Patient has been taking his Lantus and Novolog insulins- he has been consistent but he lost his meter a while ago and has been guessing at the Novolog doses. He states he "always" takes his Novolog insulin before he eats but since he doesn't have a meter, he's obviously not been taking the correct doses.  The Lantus insulin, he's not as consistent with- he works night shift at [employer] and sometimes sleeps through the timing of his Lantus.  I asked him to be more consistent with the Lantus- perhaps picking a time he is always awake- whether he is working days or nights- he thought 5pm would be a good time since his mother arrives home from work at about 4:30pm and she could wake him for his Lantus.   This admission, the patient is doing much better with his insulin routine.  During his last admission, he was only taking Lantus because he "could not afford both Lantus and Novolog".  He claims to be more diligent now because "I have a girlfriend now and I'm trying harder".  Reminded that the $10.00 Reli-on meter is less expensive than the damage he's doing to his organs when he has high blood sugars.  Patient very receptive.  Please consider ordering Lantus insulin,  to be given 2 hours prior to stopping the IV insulin infusion.  Please use the glycemic control order set to order Lantus, Novolog meal coverage and Novolog correction. Give the Novolog correction insulin immediately when the IV insulin infusion is stopped.  I called the lab to come and draw the labs in order to repeat the Bmets.   Susette Racer, RN, BA, MHA, CDE Diabetes Coordinator Inpatient Diabetes Program  [phone] (Team Pager) [phone] [facility] Office) 10/04/2014 2:37 PM   A1C 11.9 on 05/16/14 - please consider repeating A1C  Consider Lantus 15 units q day, 3 units Novolog tid with meals, and Novolog sensitive correction tid and hs.

## 2014-10-04 NOTE — ED Notes (Signed)
Mother at bedside.

## 2014-10-04 NOTE — ED Provider Notes (Signed)
[facility] Emergency Department Provider Note  ____________________________________________  Time seen: Approximately 9: 25 AM  I have reviewed the triage vital signs and the nursing notes.   HISTORY  Chief Complaint Emesis and Blood Sugar Problem    HPI [name] is a 24 y.o. male with a history of type 1 diabetes who presents today with 1 day of nausea and vomiting. Not checking glucometer home because does not have one. But, some in the emergency department here to being given one by care management. Says that he has not been eating excessively sugary foods. Did give himself 30 units of lispro this morning at 6:30 AM as well as taking his baseline long-acting insulin at night last night. Denies any abdominal pain. Says that he does have chronic low back pain. This pain he reports is unchanged today in his low back. It is aching and moderate. Denies any urinary incontinence or weakness to the lower extremities. Does report increased trouble having erections.  Denies any dysuria.Denies blood in the vomitus.   Past Medical History  Diagnosis Date  . Diabetes mellitus without complication     Type 1    There are no active problems to display for this patient.   History reviewed. No pertinent past surgical history.  No current outpatient prescriptions on file.  Allergies Benadryl  No family history on file.  Social History History  Substance Use Topics  . Smoking status: Former Games developer  . Smokeless tobacco: Not on file  . Alcohol Use: Not on file    Review of Systems Constitutional: No fever/chills Eyes: No visual changes. ENT: No sore throat. Cardiovascular: Denies chest pain. Respiratory: Denies shortness of breath. Gastrointestinal: No abdominal pain.  No diarrhea.  No constipation. Genitourinary: Negative for dysuria. Musculoskeletal: Chronic low back pain.  Denies recent injury Skin: Negative for rash. Neurological: Negative  for headaches, focal weakness or numbness.  10-point ROS otherwise negative.  ____________________________________________   PHYSICAL EXAM:  VITAL SIGNS: ED Triage Vitals  Enc Vitals Group     BP 10/04/14 0839 118/67 mmHg     Pulse Rate 10/04/14 0839 139     Resp 10/04/14 0839 21     Temp --      Temp src --      SpO2 10/04/14 0839 100 %     Weight 10/04/14 0839 145 lb (65.772 kg)     Height 10/04/14 0839 5\' 9"  (1.753 m)     Head Cir --      Peak Flow --      Pain Score 10/04/14 0840 10     Pain Loc --      Pain Edu? --      Excl. in GC? --     Constitutional: Alert and oriented. Well appearing and in no acute distress. Fruity odor on breath. Eyes: Conjunctivae are normal. PERRL. EOMI. Head: Atraumatic. Nose: No congestion/rhinnorhea. Mouth/Throat: Mucous membranes are moist.  Oropharynx non-erythematous. Neck: No stridor.   Cardiovascular: Tachycardic, regular rhythm. Grossly normal heart sounds.  Good peripheral circulation. Respiratory: Normal respiratory effort.  No retractions. Lungs CTAB. Gastrointestinal: Soft and nontender. No distention. No abdominal bruits. No CVA tenderness. Genitourinary: Normal GU exam without any masses or tenderness palpation. Patient is circumcised. Musculoskeletal: No lower extremity tenderness nor edema.  No joint effusions. Mild tenderness to palpation over the L5-S1 midline. No step-off or deformity. No ecchymosis. No saddle anesthesia. Neurologic:  Normal speech and language. No gross focal neurologic deficits are appreciated. Speech  is normal. No gait instability. Skin:  Skin is warm, dry and intact. No rash noted. Psychiatric: Mood and affect are normal. Speech and behavior are normal.  ____________________________________________   LABS (all labs ordered are listed, but only abnormal results are displayed)  Labs Reviewed  CBC WITH DIFFERENTIAL/PLATELET - Abnormal; Notable for the following:    WBC 15.2 (*)    RBC 6.24 (*)     HCT 56.1 (*)    Neutro Abs 13.6 (*)    All other components within normal limits  COMPREHENSIVE METABOLIC PANEL - Abnormal; Notable for the following:    Potassium 5.2 (*)    Chloride 99 (*)    CO2 9 (*)    Glucose, Bld 447 (*)    BUN 22 (*)    Creatinine, Ser 1.78 (*)    Total Protein 8.5 (*)    ALT 15 (*)    Alkaline Phosphatase 185 (*)    Total Bilirubin 1.4 (*)    GFR calc non Af Amer 52 (*)    Anion gap 29 (*)    All other components within normal limits  GLUCOSE, CAPILLARY - Abnormal; Notable for the following:    Glucose-Capillary 426 (*)    All other components within normal limits  URINALYSIS COMPLETEWITH MICROSCOPIC ([facility] ONLY)  BETA-HYDROXYBUTYRIC ACID  BLOOD GAS, ARTERIAL   ____________________________________________  EKG  ED ECG REPORT I, Arelia Longest, the attending physician, personally viewed and interpreted this ECG.   Date: 10/04/2014  EKG Time: 907  Rate: 129  Rhythm: sinus tachycardia  Axis: Normal axis  Intervals:none  ST&T Change: No ST elevations or depressions. No abnormal T-wave inversions.  ____________________________________________  RADIOLOGY   ____________________________________________   PROCEDURES  CRITICAL CARE Performed by: Arelia Longest   Total critical care time: 40 minutes  Critical care time was exclusive of separately billable procedures and treating other patients.  Critical care was necessary to treat or prevent imminent or life-threatening deterioration.  Critical care was time spent personally by me on the following activities: development of treatment plan with patient and/or surrogate as well as nursing, discussions with consultants, evaluation of patient's response to treatment, examination of patient, obtaining history from patient or surrogate, ordering and performing treatments and interventions, ordering and review of laboratory studies, ordering and review of radiographic studies, pulse  oximetry and re-evaluation of patient's condition. Requiring insulin drip at this time. Gap of 29 with bicarbonate of 9. ____________________________________________   INITIAL IMPRESSION / ASSESSMENT AND PLAN / ED COURSE  Pertinent labs & imaging results that were available during my care of the patient were reviewed by me and considered in my medical decision making (see chart for details).  ----------------------------------------- 10:10 AM on 10/04/2014 -----------------------------------------  Patient updated about diagnosis of DKA and need for admission to the hospital. Signed out to Dr. Allena Katz who will admit the patient. ____________________________________________   FINAL CLINICAL IMPRESSION(S) / ED DIAGNOSES  Acute diabetic ketoacidosis. Initial visit.    Myrna Blazer, MD 10/04/14 1011

## 2014-10-04 NOTE — ED Notes (Signed)
Pharmacy called for insulin gtt

## 2014-10-04 NOTE — Progress Notes (Signed)
Spoke to Dr. Imogene Burn about anion gap 15- Dr. Imogene Burn ordered to keep insulin drip infusing until anion gap 8-12.

## 2014-10-04 NOTE — ED Notes (Signed)
Pt reports that he is type one diabetic, reports he thinks he may be in DKA. States that he began vomiting this am. No meter at home to check sugars.

## 2014-10-04 NOTE — Care Management Note (Signed)
Case Management Note  Patient Details  Name: [name] MRN: [id number] Date of Birth: [birth year]  Subjective/Objective:      Pt. States he has no way to check BG at home. Have provided him a Reli On glucometer with small box of strips from CM dept. Collyn , RN  For the pt. Will do teaching for same.              Action/Plan:   Expected Discharge Date:                  Expected Discharge Plan:     In-House Referral:     Discharge planning Services     Post Acute Care Choice:    Choice offered to:     DME Arranged:    DME Agency:     HH Arranged:    HH Agency:     Status of Service:     Medicare Important Message Given:    Date Medicare IM Given:    Medicare IM give by:    Date Additional Medicare IM Given:    Additional Medicare Important Message give by:     If discussed at Long Length of Stay Meetings, dates discussed:    Additional Comments:  Berna Bue, RN 10/04/2014, 9:22 AM

## 2014-10-04 NOTE — ED Notes (Signed)
Glucometer and strips given to pts mom. Mom states they know how to use it.

## 2014-10-04 NOTE — H&P (Signed)
[facility] [place] at [facility]   PATIENT NAME: [name]    MR#:  [id number]  DATE OF BIRTH:  13-Jul-1990  DATE OF ADMISSION:  10/04/2014  PRIMARY CARE PHYSICIAN: No PCP Per Patient   REQUESTING/REFERRING PHYSICIAN: Myrna Blazer, MD  CHIEF COMPLAINT:   Chief Complaint  Patient presents with  . Emesis  . Blood Sugar Problem   nausea vomiting since a 6:30 am today  HISTORY OF PRESENT ILLNESS:  [name]  is a 24 y.o. male with a known history of diabetes type 1, previous DKA and GERD. The patient came to ED due to nausea and vomiting since 6:30 AM today. The patient was finally into early this morning. He started to have abdominal discomfort, nausea and vomiting. He denies any other symptoms. He said he is compliant to his medication including Levemir and NovoLog before meals. He feels dehydrated. His AG is at 73 and has renal failure.  PAST MEDICAL HISTORY:   Past Medical History  Diagnosis Date  . Diabetes mellitus without complication     Type 1    PAST SURGICAL HISTORY:  History reviewed. No pertinent past surgical history.  SOCIAL HISTORY:   History  Substance Use Topics  . Smoking status: Former Games developer  . Smokeless tobacco: Not on file  . Alcohol Use: Not on file   former smoker,  no smoking or alcohol drinking or illicit drug.  FAMILY HISTORY:  No family history on file. diabetes, hypertension and heart disease.  DRUG ALLERGIES:   Allergies  Allergen Reactions  . Benadryl [Diphenhydramine] Other (See Comments)    hyperactive    REVIEW OF SYSTEMS:  CONSTITUTIONAL: No fever, fatigue or weakness.  EYES: No blurred or double vision.  EARS, NOSE, AND THROAT: No tinnitus or ear pain.  RESPIRATORY: No cough, shortness of breath, wheezing or hemoptysis.  CARDIOVASCULAR: No chest pain, orthopnea, edema.  GASTROINTESTINAL: Positive nausea and vomiting, no diarrhea or abdominal pain.  GENITOURINARY: No dysuria,  hematuria.  ENDOCRINE: No polyuria, nocturia,  HEMATOLOGY: No anemia, easy bruising or bleeding SKIN: No rash or lesion. MUSCULOSKELETAL: No joint pain or arthritis.   NEUROLOGIC: No tingling, numbness, weakness.  PSYCHIATRY: No anxiety or depression.   MEDICATIONS AT HOME:   Prior to Admission medications   Not on File      VITAL SIGNS:  Blood pressure 118/67, pulse 139, resp. rate 21, height 5\' 9"  (1.753 m), weight 65.772 kg (145 lb), SpO2 100 %.  PHYSICAL EXAMINATION:  GENERAL:  24 y.o.-year-old patient lying in the bed with no acute distress.  EYES: Pupils equal, round, reactive to light and accommodation. No scleral icterus. Extraocular muscles intact.  HEENT: Head atraumatic, normocephalic. Oropharynx and nasopharynx clear.  NECK:  Supple, no jugular venous distention. No thyroid enlargement, no tenderness.  LUNGS: Normal breath sounds bilaterally, no wheezing, rales,rhonchi or crepitation. No use of accessory muscles of respiration.  CARDIOVASCULAR: S1, S2 normal. No murmurs, rubs, or gallops.  ABDOMEN: Soft, mild tenderness, nondistended. Bowel sounds present. No organomegaly or mass.  EXTREMITIES: No pedal edema, cyanosis, or clubbing.  NEUROLOGIC: Cranial nerves II through XII are intact. Muscle strength 5/5 in all extremities. Sensation intact. Gait not checked.  PSYCHIATRIC: The patient is alert and oriented x 3.  SKIN: No obvious rash, lesion, or ulcer.   LABORATORY PANEL:   CBC  Recent Labs Lab 10/04/14 0850  WBC 15.2*  HGB 18.0  HCT 56.1*  PLT 289   ------------------------------------------------------------------------------------------------------------------  Chemistries  Recent Labs Lab 10/04/14 0850  NA 137  K 5.2*  CL 99*  CO2 9*  GLUCOSE 447*  BUN 22*  CREATININE 1.78*  CALCIUM 9.6  AST 19  ALT 15*  ALKPHOS 185*  BILITOT 1.4*    ------------------------------------------------------------------------------------------------------------------  Cardiac Enzymes No results for input(s): TROPONINI in the last 168 hours. ------------------------------------------------------------------------------------------------------------------  RADIOLOGY:  No results found.  EKG:   Orders placed or performed during the hospital encounter of 10/04/14  . ED EKG   (only if pt is 23 y.o. or older and pain is above umbilicus)  . ED EKG   (only if pt is 23 y.o. or older and pain is above umbilicus)    IMPRESSION AND PLAN:   DKA Diabetes type 1 Acute renal failure Leukocytosis  The patient will be admitted to ICU. I will start DKA protocol and insulin drip. Monitor BMP every 4 hours. Acute renal failure, I will start normal saline IV support and follow-up BMP. Leukocytosis is possibly due to reaction, follow-up CBC.    All the records are reviewed and case discussed with ED provider. Management plans discussed with the patient, family and they are in agreement.  CODE STATUS: Full code  TOTAL CRITICAL TIME TAKING CARE OF THIS PATIENT: 53 minutes.    Shaune Pollack M.D on 10/04/2014 at 10:43 AM  Between 7am to 6pm - Pager - [phone]  After 6pm go to [email or web address] - password [email or web address] [facility]  [place] [facility]  Office  [phone]  CC: Primary care physician; No PCP Per Patient

## 2014-10-05 ENCOUNTER — Inpatient Hospital Stay: Payer: BLUE CROSS/BLUE SHIELD

## 2014-10-05 LAB — GLUCOSE, CAPILLARY
GLUCOSE-CAPILLARY: 137 mg/dL — AB (ref 65–99)
GLUCOSE-CAPILLARY: 137 mg/dL — AB (ref 65–99)
GLUCOSE-CAPILLARY: 42 mg/dL — AB (ref 65–99)
Glucose-Capillary: 105 mg/dL — ABNORMAL HIGH (ref 65–99)
Glucose-Capillary: 106 mg/dL — ABNORMAL HIGH (ref 65–99)
Glucose-Capillary: 130 mg/dL — ABNORMAL HIGH (ref 65–99)
Glucose-Capillary: 141 mg/dL — ABNORMAL HIGH (ref 65–99)
Glucose-Capillary: 153 mg/dL — ABNORMAL HIGH (ref 65–99)
Glucose-Capillary: 239 mg/dL — ABNORMAL HIGH (ref 65–99)
Glucose-Capillary: 299 mg/dL — ABNORMAL HIGH (ref 65–99)
Glucose-Capillary: 39 mg/dL — CL (ref 65–99)

## 2014-10-05 LAB — BASIC METABOLIC PANEL
ANION GAP: 8 (ref 5–15)
Anion gap: 7 (ref 5–15)
BUN: 18 mg/dL (ref 6–20)
BUN: 17 mg/dL (ref 6–20)
CALCIUM: 8.5 mg/dL — AB (ref 8.9–10.3)
CO2: 21 mmol/L — ABNORMAL LOW (ref 22–32)
CO2: 23 mmol/L (ref 22–32)
CREATININE: 0.94 mg/dL (ref 0.61–1.24)
Calcium: 8.4 mg/dL — ABNORMAL LOW (ref 8.9–10.3)
Chloride: 108 mmol/L (ref 101–111)
Chloride: 109 mmol/L (ref 101–111)
Creatinine, Ser: 0.95 mg/dL (ref 0.61–1.24)
GFR calc Af Amer: >60 mL/min (ref >60)
GFR calc non Af Amer: >60 mL/min (ref >60)
GFR calc non Af Amer: >60 mL/min (ref >60)
GLUCOSE: 142 mg/dL — AB (ref 65–99)
Glucose, Bld: 154 mg/dL — ABNORMAL HIGH (ref 65–99)
POTASSIUM: 3.3 mmol/L — AB (ref 3.5–5.1)
Potassium: 3.4 mmol/L — ABNORMAL LOW (ref 3.5–5.1)
SODIUM: 137 mmol/L (ref 135–145)
SODIUM: 139 mmol/L (ref 135–145)

## 2014-10-05 LAB — CBC
HCT: 48.8 % (ref 40.0–52.0)
HEMOGLOBIN: 15.7 g/dL (ref 13.0–18.0)
MCH: 28.8 pg (ref 26.0–34.0)
MCHC: 32.2 g/dL (ref 32.0–36.0)
MCV: 89.5 fL (ref 80.0–100.0)
PLATELETS: 192 10*3/uL (ref 150–440)
RBC: 5.45 MIL/uL (ref 4.40–5.90)
RDW: 13.9 % (ref 11.5–14.5)
WBC: 6.8 10*3/uL (ref 3.8–10.6)

## 2014-10-05 LAB — MAGNESIUM: Magnesium: 1.9 mg/dL (ref 1.7–2.4)

## 2014-10-05 MED ORDER — INSULIN ASPART 100 UNIT/ML ~~LOC~~ SOLN
0.0000 [IU] | Freq: Three times a day (TID) | SUBCUTANEOUS | Status: DC
  Administered 2014-10-05: 17:00:00 3 [IU] via SUBCUTANEOUS
  Administered 2014-10-06: 12:00:00 7 [IU] via SUBCUTANEOUS
  Filled 2014-10-05: qty 3
  Filled 2014-10-05: qty 7

## 2014-10-05 MED ORDER — INSULIN ASPART 100 UNIT/ML ~~LOC~~ SOLN
0.0000 [IU] | Freq: Every day | SUBCUTANEOUS | Status: DC
  Administered 2014-10-05: 3 [IU] via SUBCUTANEOUS
  Filled 2014-10-05: qty 3

## 2014-10-05 MED ORDER — POTASSIUM CHLORIDE CRYS ER 20 MEQ PO TBCR
40.0000 meq | EXTENDED_RELEASE_TABLET | Freq: Once | ORAL | Status: AC
  Administered 2014-10-05: 14:00:00 40 meq via ORAL
  Filled 2014-10-05: qty 2

## 2014-10-05 MED ORDER — INSULIN DETEMIR 100 UNIT/ML ~~LOC~~ SOLN
15.0000 [IU] | SUBCUTANEOUS | Status: DC
  Administered 2014-10-05 (×2): 15 [IU] via SUBCUTANEOUS
  Filled 2014-10-05 (×3): qty 0.15

## 2014-10-05 MED ORDER — DEXTROSE 50 % IV SOLN
INTRAVENOUS | Status: AC
  Administered 2014-10-05: 25 mL via INTRAVENOUS
  Filled 2014-10-05: qty 50

## 2014-10-05 MED ORDER — DEXTROSE 50 % IV SOLN
25.0000 mL | Freq: Once | INTRAVENOUS | Status: AC
  Administered 2014-10-05: 25 mL via INTRAVENOUS

## 2014-10-05 MED ORDER — INSULIN ASPART 100 UNIT/ML ~~LOC~~ SOLN: 0.0000 [IU] | Freq: Three times a day (TID) | SUBCUTANEOUS | Status: DC

## 2014-10-05 NOTE — Plan of Care (Signed)
Problem: Discharge Progression Outcomes Goal: Discharge plan in place and appropriate Outcome: Progressing Patient goes by [name] Patient is a Type I Diabetic

## 2014-10-05 NOTE — Progress Notes (Signed)
Spoke with Dr. Winona Legato regarding HS correction scale.  Order received to change bedtime coverage to HS scale which will not cover blood sugar unless greater than 200 at bedtime.  Will follow.  Thanks, Beryl Meager, RN, BC-ADM Inpatient Diabetes Coordinator Pager [phone] (8a-5p)

## 2014-10-05 NOTE — Progress Notes (Signed)
[facility] [place] at [facility]   PATIENT NAME: [name]    MR#:  [id number]  DATE OF BIRTH:  Sep 14, 1990  SUBJECTIVE:  CHIEF COMPLAINT:   Chief Complaint  Patient presents with  . Emesis  . Blood Sugar Problem   Came in with nausea, vomiting, abdominal discomfort as well as elevated blood glucose levels stated that she he's been taking his insulin, however, did not have glucometer and he is not sure  Where his glucometer  is at.  Was noted not voiding enough and had bladder scan which showed urine retention of approximately 850 cc patient then had a Foley get her placed which drained 250 cc he was able to void by himself 50 cc still  . Bladder scan showed more than  550cc urine in the bladder.  Denies abdominal discomfort although states that he's been playing Xbox and sometimes does not go to the bathroom until he is almost  Urinating on himself  Review of Systems  Constitutional: Negative for fever, chills and weight loss.  HENT: Negative for congestion.   Eyes: Negative for blurred vision and double vision.  Respiratory: Negative for cough, sputum production, shortness of breath and wheezing.   Cardiovascular: Negative for chest pain, palpitations, orthopnea, leg swelling and PND.  Gastrointestinal: Negative for nausea, vomiting, abdominal pain, diarrhea, constipation and blood in stool.  Genitourinary: Negative for dysuria, urgency, frequency and hematuria.  Musculoskeletal: Negative for falls.  Neurological: Negative for dizziness, tremors, focal weakness and headaches.  Endo/Heme/Allergies: Does not bruise/bleed easily.  Psychiatric/Behavioral: Negative for depression. The patient does not have insomnia.     VITAL SIGNS: Blood pressure 101/73, pulse 95, temperature 98.3 F (36.8 C), temperature source Oral, resp. rate 12, height 5\' 9"  (1.753 m), weight 65.772 kg (145 lb), SpO2 98 %.  PHYSICAL EXAMINATION:   GENERAL:  24 y.o.-year-old patient  lying in the bed with no acute distress.  Comfortable in the bed,  Not in distress.  Oral mucosa is moist EYES: Pupils equal, round, reactive to light and accommodation. No scleral icterus. Extraocular muscles intact.  HEENT: Head atraumatic, normocephalic. Oropharynx and nasopharynx clear.  NECK:  Supple, no jugular venous distention. No thyroid enlargement, no tenderness.  LUNGS: Normal breath sounds bilaterally, no wheezing, rales,rhonchi or crepitation. No use of accessory muscles of respiration.  CARDIOVASCULAR: S1, S2 normal. No murmurs, rubs, or gallops.  ABDOMEN: Soft, nontender, nondistended. Bowel sounds present. No organomegaly or mass.  No discomfort in suprapubic area and I'm not able to palpate bladder EXTREMITIES: No pedal edema, cyanosis, or clubbing.  NEUROLOGIC: Cranial nerves II through XII are intact. Muscle strength 5/5 in all extremities. Sensation intact. Gait not checked.  PSYCHIATRIC: The patient is alert and oriented x 3.  SKIN: No obvious rash, lesion, or ulcer.   ORDERS/RESULTS REVIEWED:   CBC  Recent Labs Lab 10/04/14 0850 10/05/14 0452  WBC 15.2* 6.8  HGB 18.0 15.7  HCT 56.1* 48.8  PLT 289 192  MCV 90.0 89.5  MCH 28.8 28.8  MCHC 32.0 32.2  RDW 13.8 13.9  LYMPHSABS 1.0  --   MONOABS 0.5  --   EOSABS 0.1  --   BASOSABS 0.0  --    ------------------------------------------------------------------------------------------------------------------  Chemistries   Recent Labs Lab 10/04/14 0850 10/04/14 1434 10/04/14 2042 10/05/14 0041 10/05/14 0452  NA 137 139 137 137 139  K 5.2* 4.1 3.8 3.3* 3.4*  CL 99* 110 109 108 109  CO2 9* 14* 15* 21* 23  GLUCOSE 447* 135* 230* 154* 142*  BUN 22* CREATININE 1.78* 1.07 1.01 0.95 0.94  CALCIUM 9.6 8.6* 8.2* 8.4* 8.5*  AST 19  --   --   --   --   ALT 15*  --   --   --   --   ALKPHOS 185*  --   --   --   --   BILITOT 1.4*  --   --   --   --     ------------------------------------------------------------------------------------------------------------------ estimated creatinine clearance is 113.8 mL/min (by C-G formula based on Cr of 0.94). ------------------------------------------------------------------------------------------------------------------ No results for input(s): TSH, T4TOTAL, T3FREE, THYROIDAB in the last 72 hours.  Invalid input(s): FREET3  Cardiac Enzymes No results for input(s): CKMB, TROPONINI, MYOGLOBIN in the last 168 hours.  Invalid input(s): CK ------------------------------------------------------------------------------------------------------------------ Invalid input(s): POCBNP ---------------------------------------------------------------------------------------------------------------  RADIOLOGY: No results found.  EKG:  Orders placed or performed during the hospital encounter of 10/04/14  . ED EKG   (only if pt is 23 y.o. or older and pain is above umbilicus)  . ED EKG   (only if pt is 23 y.o. or older and pain is above umbilicus)    ASSESSMENT AND PLAN:  Active Problems:   DKA, type 1   DKA (diabetic ketoacidoses)   ARF (acute renal failure)   Leukocytosis 1.   Diabetes mellitus type I  With DKA continue insulin therapy. Long-acting insulin was already initiated,  Off insulin IV drip. Continue sliding scale. Patient became hypoglycemic today in the morning in  30s to 40s after insulin was initiated early in the morning or last night.  He was started on oral diet today in the morning only.  Continue to observe blood glucose levels  today and make decisions about discharge tomorrow morning.  2.  Urinary retention radiology get . Chest found of bladder as well as kidneys. If retention is confirmed by ultrasound, patient will likely need CT scan of his abdomen and pelvis  to Rule out mass 3.  Hypokalemia supplementing IV   And by mouth, check magnesium level 4.   Nausea, vomiting resolved.  Patient is on oral diet now   Management plans discussed with the patient, family and they are in agreement.   DRUG ALLERGIES:  Allergies  Allergen Reactions  . Benadryl [Diphenhydramine] Other (See Comments)    hyperactive    CODE STATUS:     Code Status Orders        Start     Ordered   10/04/14 1225  Full code   Continuous     10/04/14 1225      TOTAL TIME TAKING CARE OF THIS PATIENT: 40 minutes.    Katharina Caper M.D on 10/05/2014 at 12:39 PM  Between 7am to 6pm - Pager - [phone]  After 6pm go to [email or web address] - password [email or web address] [facility]  [place] [facility]  Office  [phone]  CC: Primary care physician; No PCP Per Patient

## 2014-10-05 NOTE — Progress Notes (Signed)
Spoke with Dr. Winona Legato in regards to pt's CBG at 40. Gave 1/2 amp of D50 and pt drank 4oz. Of apple juice. Bladder scanned pt to find 860 mL in bladder. Encouraged pt to void but was only able to void 50 mL. Orders received to straight cath pt and continue to monitor urine and glucose level. Orders received to transfer pt to any floor without telemetry. Janelys Glassner, RN 9:36 AM

## 2014-10-05 NOTE — Plan of Care (Signed)
Problem: Discharge Progression Outcomes Goal: Other Discharge Outcomes/Goals Outcome: Progressing Patient had no c/o pain this shift Blood sugars controled well after transfer with sliding scale insulin  Patient has no c/o nausea or discomfort , VSS Patient is eating and tolerating carb control diet well Patient ambulated around unit with family member and is expected to be discharged to home tomorrow

## 2014-10-06 DIAGNOSIS — E876 Hypokalemia: Secondary | ICD-10-CM

## 2014-10-06 DIAGNOSIS — R339 Retention of urine, unspecified: Secondary | ICD-10-CM

## 2014-10-06 LAB — GLUCOSE, CAPILLARY
Glucose-Capillary: 58 mg/dL — ABNORMAL LOW (ref 65–99)
Glucose-Capillary: 97 mg/dL (ref 65–99)
Glucose-Capillary: 119 mg/dL — ABNORMAL HIGH (ref 65–99)
Glucose-Capillary: 310 mg/dL — ABNORMAL HIGH (ref 65–99)

## 2014-10-06 LAB — BASIC METABOLIC PANEL
Anion gap: 8 (ref 5–15)
BUN: 12 mg/dL (ref 6–20)
CHLORIDE: 106 mmol/L (ref 101–111)
CO2: 24 mmol/L (ref 22–32)
Calcium: 8.4 mg/dL — ABNORMAL LOW (ref 8.9–10.3)
Creatinine, Ser: 0.69 mg/dL (ref 0.61–1.24)
GFR calc Af Amer: >60 mL/min (ref >60)
GFR calc non Af Amer: >60 mL/min (ref >60)
GLUCOSE: 142 mg/dL — AB (ref 65–99)
Potassium: 3.4 mmol/L — ABNORMAL LOW (ref 3.5–5.1)
Sodium: 138 mmol/L (ref 135–145)

## 2014-10-06 MED ORDER — POTASSIUM CHLORIDE CRYS ER 20 MEQ PO TBCR
40.0000 meq | EXTENDED_RELEASE_TABLET | Freq: Once | ORAL | Status: AC
Start: 2014-10-06 — End: 2014-10-06
  Administered 2014-10-06: 40 meq via ORAL
  Filled 2014-10-06: qty 2

## 2014-10-06 NOTE — Discharge Summary (Signed)
[facility] - [place] at [facility]   PATIENT NAME: [name]    MR#:  [id number]  DATE OF BIRTH:  December 02, 1990  DATE OF ADMISSION:  10/04/2014 ADMITTING PHYSICIAN: Shaune Pollack, MD  DATE OF DISCHARGE: 10/06/2014 12:15 PM  PRIMARY CARE PHYSICIAN: No PCP Per Patient     ADMISSION DIAGNOSIS:  Diabetic ketoacidosis without coma associated with type 1 diabetes mellitus [E10.10]  DISCHARGE DIAGNOSIS:  Principal Problem:   DKA (diabetic ketoacidoses) Active Problems:   DKA, type 1   ARF (acute renal failure)   Leukocytosis   Urinary retention   Hypokalemia   SECONDARY DIAGNOSIS:   Past Medical History  Diagnosis Date  . Diabetes mellitus without complication     Type 1  . DKA, type 1     .pro HOSPITAL COURSE:  [name] is a 24 y.o. male with a known history of diabetes type 1, previous DKA and GERD. The patient came to ED due to nausea and vomiting since morning of the day of admission. He denied any other symptoms. He said he was compliant to his medication including Levemir and NovoLog before meals. He noted to be dehydrated. His AG was at 29 and patient had  renal failure. Admitted, treated with insulin IV, IVF , improved, stable to be discharged today. Discussion by a problem: 1. Diabetes mellitus type I With DKA, continue home insulin therapy. Was hypoglycemic  in the morning in 50s after short-acting insulin is given for high blood glucose level at nightnight. Warned about hypoglycemia and recommended not to take any short-acting insulin if he knows that he was not going to eat for long period of time such as at nighttime. Discussed endocrinology consult, but refused DR Solumn follow up. Glucometer for home is given upon discharged.  2. Urinary retention , resolved. Ultrasound was unremarkable 3. Hypokalemia supplementing by mouth, magnesium level was 1.9 which was normal 4. Nausea, vomiting resolved. Patient is on oral diet  now   DISCHARGE CONDITIONS:   Fair  CONSULTS OBTAINED:  Treatment Team:  Katharina Caper, MD  DRUG ALLERGIES:   Allergies  Allergen Reactions  . Benadryl [Diphenhydramine] Other (See Comments)    hyperactive    DISCHARGE MEDICATIONS:   Discharge Medication List as of 10/06/2014 11:09 AM    CONTINUE these medications which have NOT CHANGED   Details  insulin detemir (LEVEMIR) 100 UNIT/ML injection Inject 15 Units into the skin at bedtime. Adjust as needed., Until Discontinued, Historical Med    insulin lispro (HUMALOG) 100 UNIT/ML injection Inject 3-5 Units into the skin 3 (three) times daily with meals. Sliding scale. 3-5 units depending on blood sugar (200-250), Until Discontinued, Historical Med    omeprazole (PRILOSEC OTC) 20 MG tablet Take 20 mg by mouth daily., Until Discontinued, Historical Med         DISCHARGE INSTRUCTIONS:    Follow up with DR Gavin Potters in 2-3 days  If you experience worsening of your admission symptoms, develop shortness of breath, life threatening emergency, suicidal or homicidal thoughts you must seek medical attention immediately by calling 911 or calling your MD immediately  if symptoms less severe.  You Must read complete instructions/literature along with all the possible adverse reactions/side effects for all the Medicines you take and that have been prescribed to you. Take any new Medicines after you have completely understood and accept all the possible adverse reactions/side effects.   Please note  You were cared for by a hospitalist during your hospital stay. If  you have any questions about your discharge medications or the care you received while you were in the hospital after you are discharged, you can call the unit and asked to speak with the hospitalist on call if the hospitalist that took care of you is not available. Once you are discharged, your primary care physician will handle any further medical issues. Please note that NO  REFILLS for any discharge medications will be authorized once you are discharged, as it is imperative that you return to your primary care physician (or establish a relationship with a primary care physician if you do not have one) for your aftercare needs so that they can reassess your need for medications and monitor your lab values.    Today   CHIEF COMPLAINT:   Chief Complaint  Patient presents with  . Emesis  . Blood Sugar Problem    HISTORY OF PRESENT ILLNESS:  [name]  is a 24 y.o. male with a known history of known history of diabetes type 1, previous DKA and GERD. The patient came to ED due to nausea and vomiting since morning of the day of admission. He denied any other symptoms. He said he was compliant to his medication including Levemir and NovoLog before meals. He noted to be dehydrated. His AG was at 29 and patient had  renal failure. Admitted, treated with insulin IV, IVF , improved, stable to be discharged today. Discussion by a problem: 1. Diabetes mellitus type I With DKA, continue home insulin therapy. Was hypoglycemic  in the morning in 50s after short-acting insulin is given for high blood glucose level at nightnight. Warned about hypoglycemia and recommended not to take any short-acting insulin if he knows that he was not going to eat for long period of time such as at nighttime. Discussed endocrinology consult, but refused DR Solumn follow up. Glucometer for home is given upon discharged.  2. Urinary retention , resolved. Ultrasound was unremarkable 3. Hypokalemia supplementing by mouth, magnesium level was 1.9 which was normal 4. Nausea, vomiting resolved. Patient is on oral diet now   VITAL SIGNS:  Blood pressure 100/74, pulse 80, temperature 97.7 F (36.5 C), temperature source Oral, resp. rate 20, height 5\' 9"  (1.753 m), weight 65.772 kg (145 lb), SpO2 100 %.  I/O:   Intake/Output Summary (Last 24 hours) at 10/06/14 1433 Last data filed at  10/06/14 0900  Gross per 24 hour  Intake    480 ml  Output   1100 ml  Net   -620 ml    PHYSICAL EXAMINATION:  GENERAL:  24 y.o.-year-old patient lying in the bed with no acute distress.  EYES: Pupils equal, round, reactive to light and accommodation. No scleral icterus. Extraocular muscles intact.  HEENT: Head atraumatic, normocephalic. Oropharynx and nasopharynx clear.  NECK:  Supple, no jugular venous distention. No thyroid enlargement, no tenderness.  LUNGS: Normal breath sounds bilaterally, no wheezing, rales,rhonchi or crepitation. No use of accessory muscles of respiration.  CARDIOVASCULAR: S1, S2 normal. No murmurs, rubs, or gallops.  ABDOMEN: Soft, non-tender, non-distended. Bowel sounds present. No organomegaly or mass.  EXTREMITIES: No pedal edema, cyanosis, or clubbing.  NEUROLOGIC: Cranial nerves II through XII are intact. Muscle strength 5/5 in all extremities. Sensation intact. Gait not checked.  PSYCHIATRIC: The patient is alert and oriented x 3.  SKIN: No obvious rash, lesion, or ulcer.   DATA REVIEW:   CBC  Recent Labs Lab 10/05/14 0452  WBC 6.8  HGB 15.7  HCT 48.8  PLT  192    Chemistries   Recent Labs Lab 10/04/14 0850  10/05/14 0452 10/06/14 0803  NA 137  < > 139 138  K 5.2*  < > 3.4* 3.4*  CL 99*  < > 109 106  CO2 9*  < > 23 24  GLUCOSE 447*  < > 142* 142*  BUN 22*  < > 17 12  CREATININE 1.78*  < > 0.94 0.69  CALCIUM 9.6  < > 8.5* 8.4*  MG  --   --  1.9  --   AST 19  --   --   --   ALT 15*  --   --   --   ALKPHOS 185*  --   --   --   BILITOT 1.4*  --   --   --   < > = values in this interval not displayed.  Cardiac Enzymes No results for input(s): TROPONINI in the last 168 hours.  Microbiology Results  Results for orders placed or performed in visit on 07/01/13  Culture, blood (single)     Status: None   Collection Time: 07/01/13  7:54 AM  Result Value Ref Range Status   Micro Text Report   Final       COMMENT                   NO  GROWTH AEROBICALLY/ANAEROBICALLY IN 5 DAYS   ANTIBIOTIC                                                      Culture, blood (single)     Status: None   Collection Time: 07/01/13  7:54 AM  Result Value Ref Range Status   Micro Text Report   Final       COMMENT                   NO GROWTH AEROBICALLY/ANAEROBICALLY IN 5 DAYS   ANTIBIOTIC                                                        RADIOLOGY:  US Renal  10/05/2014   CLINICAL DATA:  24 year old male with 1 day history of urinary retention  EXAM: RENAL / URINARY TRACT ULTRASOUND COMPLETE  COMPARISON:  Prior limited right upper quadrant ultrasound 12/20/2013  FINDINGS: Right Kidney:  Length: 12.0 cm. Echogenicity within normal limits. No mass or hydronephrosis visualized.  Left Kidney:  Length: 11.5 cm. Echogenicity within normal limits. No mass or hydronephrosis visualized.  Bladder:  Appears normal for degree of bladder distention. Ureteral jets are noted bilaterally. Pre void volume 530.4 mL. Postvoid volume 42 mL.  IMPRESSION: Unremarkable renal ultrasound.   Electronically Signed   By: Malachy Moan M.D.   On: 10/05/2014 15:40    EKG:   Orders placed or performed during the hospital encounter of 10/04/14  . ED EKG   (only if pt is 23 y.o. or older and pain is above umbilicus)  . ED EKG   (only if pt is 23 y.o. or older and pain is above umbilicus)  . EKG 12-Lead  . EKG 12-Lead  Management plans discussed with the patient, family and they are in agreement.  CODE STATUS:     Code Status Orders        Start     Ordered   10/04/14 1225  Full code   Continuous     10/04/14 1225      TOTAL TIME TAKING CARE OF THIS PATIENT: 40 minutes.    Katharina Caper M.D on 10/06/2014 at 2:33 PM  Between 7am to 6pm - Pager - [phone]  After 6pm go to [email or web address] - password [email or web address] [facility]  [place] [facility]  Office  [phone]  CC: Primary care physician; No PCP Per Patient

## 2014-10-06 NOTE — Progress Notes (Signed)
Patient discharged home per MD order. All discharge instructions given and all questions answered. Escorted by family and aux.

## 2014-10-06 NOTE — Progress Notes (Signed)
Inpatient Diabetes Program Recommendations  AACE/ADA: New Consensus Statement on Inpatient Glycemic Control (2013)  Target Ranges:  Prepandial:   less than 140 mg/dL      Peak postprandial:   less than 180 mg/dL (1-2 hours)      Critically ill patients:  140 - 180 mg/dL   Reason for Visit: discuss discharge and insulin  Diabetes history: Type 1  Outpatient Diabetes medications: Lantus 15 units q day, Novolog based on a sliding scale Current orders for Inpatient glycemic control: Levemir 15 units qday, Novolog sensitive correction scale 0-15 units tid and 0-5 units qhs  Please consider decreasing his Levemir insulin to 10 units (0.15u/kg), add Novolog 2 units tid with meals and continue Novolog sensitive correction scale tid and hs.  Susette Racer, RN, BA, MHA, CDE Diabetes Coordinator Inpatient Diabetes Program  [phone] (Team Pager) [phone] [facility] Office) 10/06/2014 10:33 AM

## 2014-10-06 NOTE — Discharge Instructions (Signed)
Blood Glucose Monitoring °Monitoring your blood glucose (also know as blood sugar) helps you to manage your diabetes. It also helps you and your health care provider monitor your diabetes and determine how well your treatment plan is working. °WHY SHOULD YOU MONITOR YOUR BLOOD GLUCOSE? °· It can help you understand how food, exercise, and medicine affect your blood glucose. °· It allows you to know what your blood glucose is at any given moment. You can quickly tell if you are having low blood glucose (hypoglycemia) or high blood glucose (hyperglycemia). °· It can help you and your health care provider know how to adjust your medicines. °· It can help you understand how to manage an illness or adjust medicine for exercise. °WHEN SHOULD YOU TEST? °Your health care provider will help you decide how often you should check your blood glucose. This may depend on the type of diabetes you have, your diabetes control, or the types of medicines you are taking. Be sure to write down all of your blood glucose readings so that this information can be reviewed with your health care provider. See below for examples of testing times that your health care provider may suggest. °Type 1 Diabetes °· Test 4 times a day if you are in good control, using an insulin pump, or perform multiple daily injections. °· If your diabetes is not well controlled or if you are sick, you may need to monitor more often. °· It is a good idea to also monitor: °· Before and after exercise. °· Between meals and 2 hours after a meal. °· Occasionally between 2:00 a.m. and 3:00 a.m. °Type 2 Diabetes °· It can vary with each person, but generally, if you are on insulin, test 4 times a day. °· If you take medicines by mouth (orally), test 2 times a day. °· If you are on a controlled diet, test once a day. °· If your diabetes is not well controlled or if you are sick, you may need to monitor more often. °HOW TO MONITOR YOUR BLOOD GLUCOSE °Supplies  Needed °· Blood glucose meter. °· Test strips for your meter. Each meter has its own strips. You must use the strips that go with your own meter. °· A pricking needle (lancet). °· A device that holds the lancet (lancing device). °· A journal or log book to write down your results. °Procedure °· Wash your hands with soap and water. Alcohol is not preferred. °· Prick the side of your finger (not the tip) with the lancet. °· Gently milk the finger until a small drop of blood appears. °· Follow the instructions that come with your meter for inserting the test strip, applying blood to the strip, and using your blood glucose meter. °Other Areas to Get Blood for Testing °Some meters allow you to use other areas of your body (other than your finger) to test your blood. These areas are called alternative sites. The most common alternative sites are: °· The forearm. °· The thigh. °· The back area of the lower leg. °· The palm of the hand. °The blood flow in these areas is slower. Therefore, the blood glucose values you get may be delayed, and the numbers are different from what you would get from your fingers. Do not use alternative sites if you think you are having hypoglycemia. Your reading will not be accurate. Always use a finger if you are having hypoglycemia. Also, if you cannot feel your lows (hypoglycemia unawareness), always use your fingers for your   blood glucose checks. ADDITIONAL TIPS FOR GLUCOSE MONITORING  Do not reuse lancets.  Always carry your supplies with you.  All blood glucose meters have a 24-hour "hotline" number to call if you have questions or need help.  Adjust (calibrate) your blood glucose meter with a control solution after finishing a few boxes of strips. BLOOD GLUCOSE RECORD KEEPING It is a good idea to keep a daily record or log of your blood glucose readings. Most glucose meters, if not all, keep your glucose records stored in the meter. Some meters come with the ability to download  your records to your home computer. Keeping a record of your blood glucose readings is especially helpful if you are wanting to look for patterns. Make notes to go along with the blood glucose readings because you might forget what happened at that exact time. Keeping good records helps you and your health care provider to work together to achieve good diabetes management.  Document Released: 04/11/2003 Document Revised: 08/23/2013 Document Reviewed: 08/31/2012 [facility] Patient Information 2015 [place], [place]. This information is not intended to replace advice given to you by your health care provider. Make sure you discuss any questions you have with your health care provider.  Diabetic Ketoacidosis Diabetic ketoacidosis (DKA) is a life-threatening complication of type 1 diabetes. It must be quickly recognized and treated. Treatment requires hospitalization. CAUSES  When there is no insulin in the body, glucose (sugar) cannot be used, and the body breaks down fat for energy. When fat breaks down, acids (ketones) build up in the blood. Very high levels of glucose and high levels of acids lead to severe loss of body fluids (dehydration) and other dangerous chemical changes. This stresses your vital organs and can cause coma or death. SIGNS AND SYMPTOMS   Tiredness (fatigue).  Weight loss.  Excessive thirst.  Ketones in your urine.  Light-headedness.  Fruity or sweet smelling breath.  Excessive urination.  Visual changes.  Confusion or irritability.  Nausea or vomiting.  Rapid breathing.  Stomachache or abdominal pain. DIAGNOSIS  Your health care provider will diagnose DKA based on your history, physical exam, and blood tests. The health care provider will check to see if you have another illness that caused you to go into DKA. Most of this will be done quickly in an emergency room. TREATMENT   Fluid replacement to correct dehydration.  Insulin.  Correction of electrolytes,  such as potassium and sodium.  Antibiotic medicines. PREVENTION  Always take your insulin. Do not skip your insulin injections.  If you are sick, treat yourself quickly. Your body often needs more insulin to fight the illness.  Check your blood glucose regularly.  Check urine ketones if your blood glucose is greater than 240 milligrams per deciliter (mg/dL).  Do not use outdated (expired) insulin.  If your blood glucose is high, drink plenty of fluids. This helps flush out ketones. HOME CARE INSTRUCTIONS   If you are sick, follow the advice of your health care provider.  To prevent dehydration, drink enough water and fluids to keep your urine clear or pale yellow.  If you cannot eat, alternate between drinking fluids with sugar (soda, juices, flavored gelatin) and salty fluids (broth, bouillon).  If you can eat, follow your usual diet and drink sugar-free liquids (water, diet drinks).  Always take your usual dose of insulin. If you cannot eat or if your glucose is getting too low, call your health care provider for further instructions.  Continue to monitor your blood  or urine ketones every 3-4 hours around the clock. Set your alarm clock or have someone wake you up. If you are too sick, have someone test it for you.  Rest and avoid exercise. SEEK MEDICAL CARE IF:   You have a fever.  You have ketones in your urine, or your blood glucose is higher than a level your health care provider suggests. You may need extra insulin. Call your health care provider if you need advice on adjusting your insulin.  You cannot drink at least a tablespoon (15 mL) of fluid every 15-20 minutes.  You have been vomiting for more than 2 hours.  You have symptoms of DKA:  Fruity smelling breath.  Breathing faster or slower.  Becoming very sleepy. SEEK IMMEDIATE MEDICAL CARE IF:   You have signs of dehydration:  Decreased urination.  Increased thirst.  Dry skin and  mouth.  Light-headedness.  Your blood glucose is very high (as advised by your health care provider) twice in a row.  You faint.  You have chest pain or trouble breathing.  You have a sudden, severe headache.  You have sudden weakness in one arm or one leg.  You have sudden trouble speaking or swallowing.  You have vomiting or diarrhea that is getting worse after 3 hours.  You have abdominal pain. MAKE SURE YOU:   Understand these instructions.  Will watch your condition.  Will get help right away if you are not doing well or get worse. Document Released: 04/05/2000 Document Revised: 04/13/2013 Document Reviewed: 10/12/2008 [facility] Patient Information 2015 [place], [place]. This information is not intended to replace advice given to you by your health care provider. Make sure you discuss any questions you have with your health care provider.  Type 1 Diabetes Mellitus Type 1 diabetes mellitus, often simply referred to as diabetes, is a long-term (chronic) disease. It occurs when the islet cells in the pancreas that make insulin (a hormone) are destroyed and can no longer make insulin. Insulin is needed to move sugars from food into the tissue cells. The tissue cells use the sugars for energy. In people with type 1 diabetes, the sugars build up in the blood instead of going into the tissue cells. As a result, high blood sugar (hyperglycemia) develops. Without insulin, the body breaks down fat cells for the needed energy. This breakdown of fat cells produces acid chemicals (ketones), which increases the acid levels in the body. The effect of either high ketone or high sugar (glucose) levels can be life-threatening.  Type 1 diabetes was also previously called juvenile diabetes. It most often occurs before the age of 53, but it can occur at any age. RISK FACTORS A person is predisposed to developing type 1 diabetes if someone in his or her family has the disease and is exposed to certain  additional environmental triggers.  SYMPTOMS  Symptoms of type 1 diabetes may develop gradually over days to weeks or suddenly. The symptoms occur due to hyperglycemia. The symptoms can include:   Increased thirst (polydipsia).  Increased urination (polyuria).  Increased urination during the night (nocturia).  Weight loss. This weight loss may be rapid.  Frequent, recurring infections.  Tiredness (fatigue).  Weakness.  Vision changes, such as blurred vision.  Fruity smell to your breath.  Abdominal pain.  Nausea or vomiting. DIAGNOSIS  Type 1 diabetes is diagnosed when symptoms of diabetes are present and when blood glucose levels are increased. Your blood glucose level may be checked by one or more  of the following blood tests:  A fasting blood glucose test. You will not be allowed to eat for at least 8 hours before a blood sample is taken.  A random blood glucose test. Your blood glucose is checked at any time of the day regardless of when you ate.  A hemoglobin A1c blood glucose test. A hemoglobin A1c test provides information about blood glucose control over the previous 3 months. TREATMENT  Although type 1 diabetes cannot be prevented, it can be managed with insulin, diet, and exercise.  You will need to take insulin daily to keep blood glucose in the desired range.  You will need to match insulin dosing with exercise and healthy food choices. The treatment goal is to maintain the before-meal blood sugar (preprandial glucose) level at 70-130 mg/dL.  HOME CARE INSTRUCTIONS   Have your hemoglobin A1c level checked twice a year.  Perform daily blood glucose monitoring as directed by your health care provider.  Monitor urine ketones when you are ill and as directed by your health care provider.  Take your insulin as directed by your health care provider to maintain your blood glucose level in the desired range.  Never run out of insulin. It is needed every  day.  Adjust insulin based on your intake of carbohydrates. Carbohydrates can raise blood glucose levels but need to be included in your diet. Carbohydrates provide vitamins, minerals, and fiber, which are an essential part of a healthy diet. Carbohydrates are found in fruits, vegetables, whole grains, dairy products, legumes, and foods containing added sugars.  Eat healthy foods. Alternate 3 meals with 3 snacks.  Maintain a healthy weight.  Carry a medical alert card or wear your medical alert jewelry.  Carry a 15-gram carbohydrate snack with you at all times to treat low blood glucose (hypoglycemia). Some examples of 15-gram carbohydrate snacks include:  Glucose tablets, 3 or 4.  Glucose gel, 15-gram tube.  Raisins, 2 tablespoons (24 grams).  Jelly beans, 6.  Animal crackers, 8.  Fruit juice, regular soda, or low-fat milk, 4 ounces (120 mL).  Gummy treats, 9.  Recognize hypoglycemia. Hypoglycemia occurs with blood glucose levels of 70 mg/dL and below. The risk for hypoglycemia increases when fasting or skipping meals, during or after intense exercise, and during sleep. Hypoglycemia symptoms can include:  Tremors or shakes.  Decreased ability to concentrate.  Sweating.  Increased heart rate.  Headache.  Dry mouth.  Hunger.  Irritability.  Anxiety.  Restless sleep.  Altered speech or coordination.  Confusion.  Treat hypoglycemia promptly. If you are alert and able to safely swallow, follow the 15:15 rule:  Take 15-20 grams of rapid-acting glucose or carbohydrate. Rapid-acting options include glucose gel, glucose tablets, or 4 ounces (120 mL) of fruit juice, regular soda, or low-fat milk.  Check your blood glucose level 15 minutes after taking the glucose.  Take 15-20 grams more of glucose if the repeat blood glucose level is still 70 mg/dL or below.  Eat a meal or snack within 1 hour once blood glucose levels return to normal.  Be alert to polyuria and  polydipsia, which are early signs of hyperglycemia. An early awareness of hyperglycemia allows for prompt treatment. Treat hyperglycemia as directed by your health care provider.  Exercise regularly as directed by your health care provider. This includes:  Performing resistance training twice a week such as push-ups, sit-ups, lifting weights, or using resistance bands.  Performing 150 minutes of cardio exercises each week such as walking, running, or  playing sports.  Staying active and spending no more than 90 minutes at one time being inactive.  Adjust your insulin dosing and food intake as needed if you start a new exercise or sport.  Follow your sick-day plan at any time you are unable to eat or drink as usual.   Do not use any tobacco products including cigarettes, chewing tobacco, or electronic cigarettes. If you need help quitting, ask your health care provider.  Limit alcohol intake to no more than 1 drink per day for nonpregnant women and 2 drinks per day for men. You should drink alcohol only when you are also eating food. Talk with your health care provider about whether alcohol is safe for you. Tell your health care provider if you drink alcohol several times a week.  Keep all follow-up visits as directed by your health care provider.  Schedule an eye exam within 5 years of diagnosis and then annually.  Perform daily skin and foot care. Examine your skin and feet daily for cuts, bruises, redness, nail problems, bleeding, blisters, or sores. A foot exam by a health care provider should be done annually.  Brush your teeth and gums at least twice a day and floss at least once a day. Follow up with your dentist regularly.  Share your diabetes management plan with your workplace or school.  Stay up-to-date with immunizations. It is recommended that people with diabetes who are over 53 years old get the pneumonia vaccine. In some cases, two separate shots may be given. Ask your  health care provider if your pneumonia vaccination is up-to-date.  Learn to manage stress.  Obtain ongoing diabetes education and support as needed.  Participate in or seek rehabilitation as needed to maintain or improve independence and quality of life. Request a physical or occupational therapy referral if you are having foot or hand numbness, or difficulties with grooming, dressing, eating, or physical activity. SEEK MEDICAL CARE IF:   You are unable to eat food or drink fluids for more than 6 hours.  You have nausea and vomiting for more than 6 hours.  Your blood glucose level is over 240 mg/dL.  There is a change in mental status.  You develop an additional serious illness.  You have diarrhea for more than 6 hours.  You have been sick or have had a fever for a couple of days and are not getting better.  You have pain during any physical activity. SEEK IMMEDIATE MEDICAL CARE IF:  You have difficulty breathing.  You have moderate to large ketone levels. MAKE SURE YOU:  Understand these instructions.  Will watch your condition.  Will get help right away if you are not doing well or get worse. Document Released: 04/05/2000 Document Revised: 08/23/2013 Document Reviewed: 11/05/2011 [facility] Patient Information 2015 [place], [place]. This information is not intended to replace advice given to you by your health care provider. Make sure you discuss any questions you have with your health care provider.  How to Avoid Diabetes Problems You can do a lot to prevent or slow down diabetes problems. Following your diabetes plan and taking care of yourself can reduce your risk of serious or life-threatening complications. Below, you will find certain things you can do to prevent diabetes problems. MANAGE YOUR DIABETES Follow your health care provider's, nurse educator's, and dietitian's instructions for managing your diabetes. They will teach you the basics of diabetes care. They can  help answer questions you may have. Learn about diabetes and make healthy  choices regarding eating and physical activity. Monitor your blood glucose level regularly. Your health care provider will help you decide how often to check your blood glucose level depending on your treatment goals and how well you are meeting them.  DO NOT USE NICOTINE Nicotine and diabetes are a dangerous combination. Nicotine raises your risk for diabetes problems. If you quit using nicotine, you will lower your risk for heart attack, stroke, nerve disease, and kidney disease. Your cholesterol and your blood pressure levels may improve. Your blood circulation will also improve. Do not use any tobacco products, including cigarettes, chewing tobacco, or electronic cigarettes. If you need help quitting, ask your health care provider. KEEP YOUR BLOOD PRESSURE UNDER CONTROL Keeping your blood pressure under control will help prevent damage to your eyes, kidneys, heart, and blood vessels. Blood pressure consists of two numbers. The top number should be below 120, and the bottom number should be below 80 (120/80). Keep your blood pressure as close to these numbers as you can. If you already have kidney disease, you may want even lower blood pressure to protect your kidneys. Talk to your health care provider to make sure that your blood pressure goal is right for your needs. Meal planning, medicines, and exercise can help you reach your blood pressure target. Have your blood pressure checked at every visit with your health care provider. KEEP YOUR CHOLESTEROL UNDER CONTROL Normal cholesterol levels will help prevent heart disease and stroke. These are the biggest health problems for people with diabetes. Keeping cholesterol levels under control can also help with blood flow. Have your cholesterol level checked at least once a year. Your health care provider may prescribe a medicine known as a statin. Statins lower your cholesterol. If you  are not taking a statin, ask your health care provider if you should be. Meal planning, exercise, and medicines can help you reach your cholesterol targets.  SCHEDULE AND KEEP YOUR ANNUAL PHYSICAL EXAMS AND EYE EXAMS Your health care provider will tell you how often he or she wants to see you depending on your plan of treatment. It is important that you keep these appointments so that possible problems can be identified early and complications can be avoided or treated.  Every visit with your health care provider should include your weight, blood pressure, and an evaluation of your blood glucose control.  Your hemoglobin A1c should be checked:  At least twice a year if you are at your goal.  Every 3 months if there are changes in treatment.  If you are not meeting your goals.  Your blood lipids should be checked yearly. You should also be checked yearly to see if you have protein in your urine (microalbumin).  Schedule a dilated eye exam within 5 years of your diagnosis if you have type 1 diabetes, and then yearly. Schedule a dilated eye exam at diagnosis if you have type 2 diabetes, and then yearly. All exams thereafter can be extended to every 2 to 3 years if one or more exams have been normal. KEEP YOUR VACCINES CURRENT The flu vaccine is recommended yearly. The formula for the vaccine changes every year and needs to be updated for the best protection against current viruses. It is recommended that people with diabetes who are over 2 years old get the pneumonia vaccine. In some cases, two separate shots may be given. Ask your health care provider if your pneumonia vaccination is up-to-date. However, there are some instances where another vaccine is recommended.  Check with your health care provider. TAKE CARE OF YOUR FEET  Diabetes may cause you to have a poor blood supply (circulation) to your legs and feet. Because of this, the skin may be thinner, break easier, and heal more slowly. You  also may have nerve damage in your legs and feet, causing decreased feeling. You may not notice minor injuries to your feet that could lead to serious problems or infections. Taking care of your feet is very important. Visual foot exams are performed at every routine medical visit. The exams check for cuts, injuries, or other problems with the feet. A comprehensive foot exam should be done yearly. This includes visual inspection as well as assessing foot pulses and testing for loss of sensation. You should also do the following:  Inspect your feet daily for cuts, calluses, blisters, ingrown toenails, and signs of infection, such as redness, swelling, or pus.  Wash and dry your feet thoroughly, especially between the toes.  Avoid soaking your feet regularly in hot water baths.  Moisturize dry skin with lotion, avoiding areas between your toes.  Cut toenails straight across and file the edges.  Avoid shoes that do not fit well or have areas that irritate your skin.  Avoid going barefooted or wearing only socks. Your feet need protection. TAKE CARE OF YOUR TEETH People with poorly controlled diabetes are more likely to have gum (periodontal) disease. These infections make diabetes harder to control. Periodontal diseases, if left untreated, can lead to tooth loss. Brush your teeth twice a day, floss, and see your dentist for checkups and cleaning every 6 months, or 2 times a year. ASK YOUR HEALTH CARE PROVIDER ABOUT TAKING ASPIRIN Taking aspirin daily is recommended to help prevent cardiovascular disease in people with and without diabetes. Ask your health care provider if this would benefit you and what dose he or she would recommend. DRINK RESPONSIBLY Moderate amounts of alcohol (less than 1 drink per day for adult women and less than 2 drinks per day for adult men) have a minimal effect on blood glucose if ingested with food. It is important to eat food with alcohol to avoid hypoglycemia. People  should avoid alcohol if they have a history of alcohol abuse or dependence, if they are pregnant, and if they have liver disease, pancreatitis, advanced neuropathy, or severe hypertriglyceridemia. LESSEN STRESS Living with diabetes can be stressful. When you are under stress, your blood glucose may be affected in two ways:  Stress hormones may cause your blood glucose to rise.  You may be distracted from taking good care of yourself. It is a good idea to be aware of your stress level and make changes that are necessary to help you better manage challenging situations. Support groups, planned relaxation, a hobby you enjoy, meditation, healthy relationships, and exercise all work to lower your stress level. If your efforts do not seem to be helping, get help from your health care provider or a trained mental health professional. Document Released: 12/25/2010 Document Revised: 08/23/2013 Document Reviewed: 06/02/2013 [facility] Patient Information 2015 [place], [place]. This information is not intended to replace advice given to you by your health care provider. Make sure you discuss any questions you have with your health care provider.

## 2014-10-06 NOTE — Progress Notes (Signed)
[facility] - [place] at [facility]   PATIENT NAME: [name]    MR#:  [id number]  DATE OF BIRTH:  07-17-1990  SUBJECTIVE:  CHIEF COMPLAINT:   Chief Complaint  Patient presents with  . Emesis  . Blood Sugar Problem   Came in with nausea, vomiting, abdominal discomfort as well as elevated blood glucose levels in DKA stated that he's been taking his insulin, however, did not have glucometer and he is not sure  Where his glucometer  is at.  He feels good today. Able to eat and drink. No abdominal discomfort. Normal voiding,  back on his usual insulin Lantus dose, but again was hypoglycemic today in the morning, however, was hyperglycemic last night and was given additional dose of short-acting insulin. Wants to go home Review of Systems  Constitutional: Negative for fever, chills and weight loss.  HENT: Negative for congestion.   Eyes: Negative for blurred vision and double vision.  Respiratory: Negative for cough, sputum production, shortness of breath and wheezing.   Cardiovascular: Negative for chest pain, palpitations, orthopnea, leg swelling and PND.  Gastrointestinal: Negative for nausea, vomiting, abdominal pain, diarrhea, constipation and blood in stool.  Genitourinary: Negative for dysuria, urgency, frequency and hematuria.  Musculoskeletal: Negative for falls.  Neurological: Negative for dizziness, tremors, focal weakness and headaches.  Endo/Heme/Allergies: Does not bruise/bleed easily.  Psychiatric/Behavioral: Negative for depression. The patient does not have insomnia.     VITAL SIGNS: Blood pressure 100/74, pulse 80, temperature 97.7 F (36.5 C), temperature source Oral, resp. rate 20, height 5\' 9"  (1.753 m), weight 65.772 kg (145 lb), SpO2 100 %.  PHYSICAL EXAMINATION:   GENERAL:  24 y.o.-year-old patient sitting in a chair with no acute distress.  Oral mucosa is moist.  EYES: Pupils equal, round, reactive to light and accommodation. No  scleral icterus. Extraocular muscles intact.  HEENT: Head atraumatic, normocephalic. Oropharynx and nasopharynx clear.  NECK:  Supple, no jugular venous distention. No thyroid enlargement, no tenderness.  LUNGS: Normal breath sounds bilaterally, no wheezing, rales,rhonchi or crepitation. No use of accessory muscles of respiration.  CARDIOVASCULAR: S1, S2 normal. No murmurs, rubs, or gallops.  ABDOMEN: Soft, nontender, nondistended. Bowel sounds present. No organomegaly or mass.  No discomfort in suprapubic area and I'm not able to palpate bladder EXTREMITIES: No pedal edema, cyanosis, or clubbing.  NEUROLOGIC: Cranial nerves II through XII are intact. Muscle strength 5/5 in all extremities. Sensation intact. Gait not checked.  PSYCHIATRIC: The patient is alert and oriented x 3.  SKIN: No obvious rash, lesion, or ulcer.   ORDERS/RESULTS REVIEWED:   CBC  Recent Labs Lab 10/04/14 0850 10/05/14 0452  WBC 15.2* 6.8  HGB 18.0 15.7  HCT 56.1* 48.8  PLT 289 192  MCV 90.0 89.5  MCH 28.8 28.8  MCHC 32.0 32.2  RDW 13.8 13.9  LYMPHSABS 1.0  --   MONOABS 0.5  --   EOSABS 0.1  --   BASOSABS 0.0  --    ------------------------------------------------------------------------------------------------------------------  Chemistries   Recent Labs Lab 10/04/14 0850 10/04/14 1434 10/04/14 2042 10/05/14 0041 10/05/14 0452 10/06/14 0803  NA 137 139 137 137 139 138  K 5.2* 4.1 3.8 3.3* 3.4* 3.4*  CL 99* 110 109 108 109 106  CO2 9* 14* 15* 21* 23 24  GLUCOSE 447* 135* 230* 154* 142* 142*  BUN 22* 20 19 18 17 12   CREATININE 1.78* 1.07 1.01 0.95 0.94 0.69  CALCIUM 9.6 8.6* 8.2* 8.4* 8.5* 8.4*  MG  --   --   --   --  1.9  --   AST 19  --   --   --   --   --   ALT 15*  --   --   --   --   --   ALKPHOS 185*  --   --   --   --   --   BILITOT 1.4*  --   --   --   --   --     ------------------------------------------------------------------------------------------------------------------ estimated creatinine clearance is 133.7 mL/min (by C-G formula based on Cr of 0.69). ------------------------------------------------------------------------------------------------------------------ No results for input(s): TSH, T4TOTAL, T3FREE, THYROIDAB in the last 72 hours.  Invalid input(s): FREET3  Cardiac Enzymes No results for input(s): CKMB, TROPONINI, MYOGLOBIN in the last 168 hours.  Invalid input(s): CK ------------------------------------------------------------------------------------------------------------------ Invalid input(s): POCBNP ---------------------------------------------------------------------------------------------------------------  RADIOLOGY: US Renal  10/05/2014   CLINICAL DATA:  24 year old male with 1 day history of urinary retention  EXAM: RENAL / URINARY TRACT ULTRASOUND COMPLETE  COMPARISON:  Prior limited right upper quadrant ultrasound 12/20/2013  FINDINGS: Right Kidney:  Length: 12.0 cm. Echogenicity within normal limits. No mass or hydronephrosis visualized.  Left Kidney:  Length: 11.5 cm. Echogenicity within normal limits. No mass or hydronephrosis visualized.  Bladder:  Appears normal for degree of bladder distention. Ureteral jets are noted bilaterally. Pre void volume 530.4 mL. Postvoid volume 42 mL.  IMPRESSION: Unremarkable renal ultrasound.   Electronically Signed   By: Malachy Moan M.D.   On: 10/05/2014 15:40    EKG:  Orders placed or performed during the hospital encounter of 10/04/14  . ED EKG   (only if pt is 23 y.o. or older and pain is above umbilicus)  . ED EKG   (only if pt is 23 y.o. or older and pain is above umbilicus)  . EKG 12-Lead  . EKG 12-Lead    ASSESSMENT AND PLAN:  Principal Problem:   DKA, type 1 Active Problems:   DKA (diabetic ketoacidoses)   ARF (acute renal failure)   Leukocytosis 1.    Diabetes mellitus type I  With DKA,  continue home insulin therapy. Still hypoglycemic today in the morning in  50s after short-acting insulin was given for blood glucose level of 299 last night.  Warned about hypoglycemia and recommended not to take any short-acting insulin if he knows that he is not going to eat for long period of time such as at nighttime,  discharge today.  2.  Urinary retention , resolved. Ultrasound is unremarkable 3.  Hypokalemia supplementing  by mouth, magnesium level was 1.9 which is normal 4.   Nausea, vomiting resolved. Patient is on oral diet now   Management plans discussed with the patient, family and they are in agreement.   DRUG ALLERGIES:  Allergies  Allergen Reactions  . Benadryl [Diphenhydramine] Other (See Comments)    hyperactive    CODE STATUS:     Code Status Orders        Start     Ordered   10/04/14 1225  Full code   Continuous     10/04/14 1225      TOTAL TIME TAKING CARE OF THIS PATIENT: 40 minutes.    Katharina Caper M.D on 10/06/2014 at 10:42 AM  Between 7am to 6pm - Pager - [phone]  After 6pm go to [email or web address] - password [email or web address] [facility]  [place] [facility]  Office  [phone]  CC: Primary care physician; No PCP Per Patient

## 2014-10-06 NOTE — Plan of Care (Signed)
Problem: Discharge Progression Outcomes Goal: Other Discharge Outcomes/Goals Outcome: Progressing Plan of care progress to goal for: Pain-no c/o pain this shift Hemodynamically-VSS  Complications-pt had low bs this morning, given crackers w/peanut butter, and ginger ale, bs improved Diet-tolerating diet at this time Activity-pt with with no assistance

## 2014-10-06 NOTE — Progress Notes (Signed)
Pt morning BS was 58, pt given graham crackers w/ peanut butter and ginger ale. BS rechecked and was 97. Orson Ape, RN

## 2016-01-08 ENCOUNTER — Encounter: Payer: Self-pay | Admitting: Emergency Medicine

## 2016-01-08 ENCOUNTER — Emergency Department: Payer: BLUE CROSS/BLUE SHIELD

## 2016-01-08 ENCOUNTER — Inpatient Hospital Stay
Admission: EM | Admit: 2016-01-08 | Discharge: 2016-01-10 | DRG: 638 | Disposition: A | Discharge status: Home / Self Care | Payer: BLUE CROSS/BLUE SHIELD | Attending: Internal Medicine | Admitting: Internal Medicine

## 2016-01-08 ENCOUNTER — Inpatient Hospital Stay: Payer: BLUE CROSS/BLUE SHIELD

## 2016-01-08 DIAGNOSIS — K2970 Gastritis, unspecified, without bleeding: Secondary | ICD-10-CM | POA: Diagnosis present

## 2016-01-08 DIAGNOSIS — Z833 Family history of diabetes mellitus: Secondary | ICD-10-CM | POA: Diagnosis not present

## 2016-01-08 DIAGNOSIS — R05 Cough: Secondary | ICD-10-CM

## 2016-01-08 DIAGNOSIS — Z23 Encounter for immunization: Secondary | ICD-10-CM | POA: Diagnosis not present

## 2016-01-08 DIAGNOSIS — Z794 Long term (current) use of insulin: Secondary | ICD-10-CM

## 2016-01-08 DIAGNOSIS — D72829 Elevated white blood cell count, unspecified: Secondary | ICD-10-CM | POA: Diagnosis present

## 2016-01-08 DIAGNOSIS — R109 Unspecified abdominal pain: Secondary | ICD-10-CM

## 2016-01-08 DIAGNOSIS — F17200 Nicotine dependence, unspecified, uncomplicated: Secondary | ICD-10-CM | POA: Diagnosis present

## 2016-01-08 DIAGNOSIS — E1110 Type 2 diabetes mellitus with ketoacidosis without coma: Secondary | ICD-10-CM | POA: Diagnosis present

## 2016-01-08 DIAGNOSIS — N179 Acute kidney failure, unspecified: Secondary | ICD-10-CM | POA: Diagnosis present

## 2016-01-08 DIAGNOSIS — K219 Gastro-esophageal reflux disease without esophagitis: Secondary | ICD-10-CM | POA: Diagnosis present

## 2016-01-08 DIAGNOSIS — E10649 Type 1 diabetes mellitus with hypoglycemia without coma: Secondary | ICD-10-CM | POA: Diagnosis present

## 2016-01-08 DIAGNOSIS — E1010 Type 1 diabetes mellitus with ketoacidosis without coma: Principal | ICD-10-CM

## 2016-01-08 DIAGNOSIS — R17 Unspecified jaundice: Secondary | ICD-10-CM | POA: Diagnosis present

## 2016-01-08 DIAGNOSIS — R059 Cough, unspecified: Secondary | ICD-10-CM

## 2016-01-08 LAB — BASIC METABOLIC PANEL
ANION GAP: 19 — AB (ref 5–15)
ANION GAP: 9 (ref 5–15)
ANION GAP: 4 — AB (ref 5–15)
Anion gap: 16 — ABNORMAL HIGH (ref 5–15)
BUN: 28 mg/dL — AB (ref 6–20)
BUN: 24 mg/dL — ABNORMAL HIGH (ref 6–20)
BUN: 19 mg/dL (ref 6–20)
BUN: 15 mg/dL (ref 6–20)
CHLORIDE: 101 mmol/L (ref 101–111)
CHLORIDE: 102 mmol/L (ref 101–111)
CHLORIDE: 103 mmol/L (ref 101–111)
CHLORIDE: 105 mmol/L (ref 101–111)
CO2: 22 mmol/L (ref 22–32)
CO2: 16 mmol/L — AB (ref 22–32)
CO2: 25 mmol/L (ref 22–32)
CO2: 27 mmol/L (ref 22–32)
CREATININE: 1.12 mg/dL (ref 0.61–1.24)
CREATININE: 0.91 mg/dL (ref 0.61–1.24)
Calcium: 8.4 mg/dL — ABNORMAL LOW (ref 8.9–10.3)
Calcium: 8.2 mg/dL — ABNORMAL LOW (ref 8.9–10.3)
Calcium: 8.4 mg/dL — ABNORMAL LOW (ref 8.9–10.3)
Calcium: 8.4 mg/dL — ABNORMAL LOW (ref 8.9–10.3)
Creatinine, Ser: 1.21 mg/dL (ref 0.61–1.24)
Creatinine, Ser: 0.93 mg/dL (ref 0.61–1.24)
GFR calc non Af Amer: >60 mL/min (ref >60)
GFR calc non Af Amer: >60 mL/min (ref >60)
GFR calc non Af Amer: >60 mL/min (ref >60)
Glucose, Bld: 150 mg/dL — ABNORMAL HIGH (ref 65–99)
Glucose, Bld: 206 mg/dL — ABNORMAL HIGH (ref 65–99)
Glucose, Bld: 97 mg/dL (ref 65–99)
Glucose, Bld: 104 mg/dL — ABNORMAL HIGH (ref 65–99)
POTASSIUM: 3.8 mmol/L (ref 3.5–5.1)
POTASSIUM: 3.1 mmol/L — AB (ref 3.5–5.1)
POTASSIUM: 3 mmol/L — AB (ref 3.5–5.1)
POTASSIUM: 2.9 mmol/L — AB (ref 3.5–5.1)
SODIUM: 139 mmol/L (ref 135–145)
SODIUM: 137 mmol/L (ref 135–145)
SODIUM: 137 mmol/L (ref 135–145)
SODIUM: 136 mmol/L (ref 135–145)

## 2016-01-08 LAB — BLOOD GAS, VENOUS
ACID-BASE DEFICIT: 11.6 mmol/L — AB (ref 0.0–2.0)
Bicarbonate: 14.5 mmol/L — ABNORMAL LOW (ref 20.0–28.0)
O2 Saturation: 49.7 %
PCO2 VEN: 33 mmHg — AB (ref 44.0–60.0)
Patient temperature: 37
pH, Ven: 7.25 (ref 7.250–7.430)
pO2, Ven: 32 mmHg (ref 32.0–45.0)

## 2016-01-08 LAB — MAGNESIUM: MAGNESIUM: 1.9 mg/dL (ref 1.7–2.4)

## 2016-01-08 LAB — COMPREHENSIVE METABOLIC PANEL
ALK PHOS: 113 U/L (ref 38–126)
ALT: 28 U/L (ref 17–63)
ANION GAP: 32 — AB (ref 5–15)
AST: 37 U/L (ref 15–41)
Albumin: 4.9 g/dL (ref 3.5–5.0)
BILIRUBIN TOTAL: 4.1 mg/dL — AB (ref 0.3–1.2)
BUN: 35 mg/dL — ABNORMAL HIGH (ref 6–20)
CALCIUM: 9.4 mg/dL (ref 8.9–10.3)
CO2: 14 mmol/L — ABNORMAL LOW (ref 22–32)
CREATININE: 1.93 mg/dL — AB (ref 0.61–1.24)
Chloride: 90 mmol/L — ABNORMAL LOW (ref 101–111)
GFR calc non Af Amer: 47 mL/min — ABNORMAL LOW (ref >60)
GFR, EST AFRICAN AMERICAN: 54 mL/min — AB (ref >60)
Glucose, Bld: 361 mg/dL — ABNORMAL HIGH (ref 65–99)
Potassium: 3.5 mmol/L (ref 3.5–5.1)
Sodium: 136 mmol/L (ref 135–145)
TOTAL PROTEIN: 8.5 g/dL — AB (ref 6.5–8.1)

## 2016-01-08 LAB — GLUCOSE, CAPILLARY
GLUCOSE-CAPILLARY: 192 mg/dL — AB (ref 65–99)
GLUCOSE-CAPILLARY: 139 mg/dL — AB (ref 65–99)
GLUCOSE-CAPILLARY: 128 mg/dL — AB (ref 65–99)
GLUCOSE-CAPILLARY: 143 mg/dL — AB (ref 65–99)
GLUCOSE-CAPILLARY: 219 mg/dL — AB (ref 65–99)
GLUCOSE-CAPILLARY: 213 mg/dL — AB (ref 65–99)
GLUCOSE-CAPILLARY: 148 mg/dL — AB (ref 65–99)
GLUCOSE-CAPILLARY: 89 mg/dL (ref 65–99)
GLUCOSE-CAPILLARY: 108 mg/dL — AB (ref 65–99)
Glucose-Capillary: 386 mg/dL — ABNORMAL HIGH (ref 65–99)
Glucose-Capillary: 245 mg/dL — ABNORMAL HIGH (ref 65–99)
Glucose-Capillary: 170 mg/dL — ABNORMAL HIGH (ref 65–99)
Glucose-Capillary: 204 mg/dL — ABNORMAL HIGH (ref 65–99)
Glucose-Capillary: 169 mg/dL — ABNORMAL HIGH (ref 65–99)
Glucose-Capillary: 196 mg/dL — ABNORMAL HIGH (ref 65–99)
Glucose-Capillary: 148 mg/dL — ABNORMAL HIGH (ref 65–99)
Glucose-Capillary: 80 mg/dL (ref 65–99)

## 2016-01-08 LAB — LIPASE, BLOOD: Lipase: 22 U/L (ref 11–51)

## 2016-01-08 LAB — URINALYSIS COMPLETE WITH MICROSCOPIC (ARMC ONLY)
BILIRUBIN URINE: NEGATIVE
Bacteria, UA: NONE SEEN
Leukocytes, UA: NEGATIVE
Nitrite: NEGATIVE
PH: 5 (ref 5.0–8.0)
Protein, ur: 30 mg/dL — AB
SQUAMOUS EPITHELIAL / LPF: NONE SEEN
Specific Gravity, Urine: 1.02 (ref 1.005–1.030)

## 2016-01-08 LAB — PHOSPHORUS: PHOSPHORUS: 1.5 mg/dL — AB (ref 2.5–4.6)

## 2016-01-08 LAB — CBC
HCT: 50.7 % (ref 40.0–52.0)
HEMOGLOBIN: 17.3 g/dL (ref 13.0–18.0)
MCH: 29.9 pg (ref 26.0–34.0)
MCHC: 34.1 g/dL (ref 32.0–36.0)
MCV: 87.6 fL (ref 80.0–100.0)
Platelets: 310 10*3/uL (ref 150–440)
RBC: 5.78 MIL/uL (ref 4.40–5.90)
RDW: 13.9 % (ref 11.5–14.5)
WBC: 19.8 10*3/uL — AB (ref 3.8–10.6)

## 2016-01-08 LAB — MRSA PCR SCREENING: MRSA by PCR: NEGATIVE

## 2016-01-08 MED ORDER — SODIUM CHLORIDE 0.9 % IV SOLN: INTRAVENOUS | Status: DC

## 2016-01-08 MED ORDER — INSULIN DETEMIR 100 UNIT/ML ~~LOC~~ SOLN
22.0000 [IU] | SUBCUTANEOUS | Status: DC
  Administered 2016-01-08: 22 [IU] via SUBCUTANEOUS
  Filled 2016-01-08 (×2): qty 0.22

## 2016-01-08 MED ORDER — ACETAMINOPHEN 650 MG RE SUPP: 650.0000 mg | Freq: Four times a day (QID) | RECTAL | Status: DC | PRN

## 2016-01-08 MED ORDER — POTASSIUM CHLORIDE CRYS ER 20 MEQ PO TBCR
40.0000 meq | EXTENDED_RELEASE_TABLET | ORAL | Status: AC
  Administered 2016-01-08 (×2): 40 meq via ORAL
  Filled 2016-01-08 (×2): qty 2

## 2016-01-08 MED ORDER — SODIUM CHLORIDE 0.9 % IV BOLUS (SEPSIS)
1000.0000 mL | Freq: Once | INTRAVENOUS | Status: AC
  Administered 2016-01-08: 1000 mL via INTRAVENOUS

## 2016-01-08 MED ORDER — ENOXAPARIN SODIUM 40 MG/0.4ML ~~LOC~~ SOLN
40.0000 mg | SUBCUTANEOUS | Status: DC
  Administered 2016-01-08 – 2016-01-09 (×2): 40 mg via SUBCUTANEOUS
  Filled 2016-01-08 (×2): qty 0.4

## 2016-01-08 MED ORDER — SODIUM CHLORIDE 0.9 % IV SOLN
INTRAVENOUS | Status: DC
  Administered 2016-01-08: 21:00:00 via INTRAVENOUS

## 2016-01-08 MED ORDER — ONDANSETRON HCL 4 MG/2ML IJ SOLN: 4.0000 mg | Freq: Four times a day (QID) | INTRAMUSCULAR | Status: DC | PRN

## 2016-01-08 MED ORDER — ENOXAPARIN SODIUM 40 MG/0.4ML ~~LOC~~ SOLN: 30.0000 mg | SUBCUTANEOUS | Status: DC

## 2016-01-08 MED ORDER — ONDANSETRON HCL 4 MG PO TABS: 4.0000 mg | ORAL_TABLET | Freq: Four times a day (QID) | ORAL | Status: DC | PRN

## 2016-01-08 MED ORDER — ONDANSETRON HCL 4 MG/2ML IJ SOLN
4.0000 mg | Freq: Once | INTRAMUSCULAR | Status: AC
  Administered 2016-01-08: 4 mg via INTRAVENOUS

## 2016-01-08 MED ORDER — HYDROCODONE-ACETAMINOPHEN 5-325 MG PO TABS
1.0000 | ORAL_TABLET | ORAL | Status: DC | PRN
Start: 2016-01-08 — End: 2016-01-10

## 2016-01-08 MED ORDER — ACETAMINOPHEN 325 MG PO TABS: 650.0000 mg | ORAL_TABLET | Freq: Four times a day (QID) | ORAL | Status: DC | PRN

## 2016-01-08 MED ORDER — ONDANSETRON HCL 4 MG/2ML IJ SOLN
4.0000 mg | Freq: Four times a day (QID) | INTRAMUSCULAR | Status: DC | PRN
  Administered 2016-01-08 – 2016-01-09 (×2): 4 mg via INTRAVENOUS
  Filled 2016-01-08 (×2): qty 2

## 2016-01-08 MED ORDER — FAMOTIDINE IN NACL 20-0.9 MG/50ML-% IV SOLN
20.0000 mg | Freq: Two times a day (BID) | INTRAVENOUS | Status: DC
  Administered 2016-01-08 – 2016-01-09 (×4): 20 mg via INTRAVENOUS
  Filled 2016-01-08 (×6): qty 50

## 2016-01-08 MED ORDER — ONDANSETRON HCL 4 MG/2ML IJ SOLN
INTRAMUSCULAR | Status: AC
  Filled 2016-01-08: qty 2

## 2016-01-08 MED ORDER — BISACODYL 5 MG PO TBEC: 5.0000 mg | DELAYED_RELEASE_TABLET | Freq: Every day | ORAL | Status: DC | PRN

## 2016-01-08 MED ORDER — DEXTROSE-NACL 5-0.45 % IV SOLN
INTRAVENOUS | Status: DC
  Administered 2016-01-08 (×2): via INTRAVENOUS

## 2016-01-08 MED ORDER — ONDANSETRON HCL 4 MG/2ML IJ SOLN
INTRAMUSCULAR | Status: AC
  Administered 2016-01-08: 4 mg via INTRAVENOUS
  Filled 2016-01-08: qty 2

## 2016-01-08 MED ORDER — INSULIN ASPART 100 UNIT/ML ~~LOC~~ SOLN
0.0000 [IU] | Freq: Three times a day (TID) | SUBCUTANEOUS | Status: DC
  Administered 2016-01-09: 1 [IU] via SUBCUTANEOUS
  Administered 2016-01-09: 2 [IU] via SUBCUTANEOUS
  Filled 2016-01-08: qty 1
  Filled 2016-01-08: qty 2

## 2016-01-08 MED ORDER — INSULIN ASPART 100 UNIT/ML ~~LOC~~ SOLN
0.0000 [IU] | Freq: Every day | SUBCUTANEOUS | Status: DC
  Filled 2016-01-08: qty 1

## 2016-01-08 MED ORDER — INFLUENZA VAC SPLIT QUAD 0.5 ML IM SUSY
0.5000 mL | PREFILLED_SYRINGE | INTRAMUSCULAR | Status: AC
  Administered 2016-01-09: 0.5 mL via INTRAMUSCULAR
  Filled 2016-01-08: qty 0.5

## 2016-01-08 MED ORDER — SODIUM CHLORIDE 0.9 % IV SOLN
INTRAVENOUS | Status: DC
  Administered 2016-01-08: 1.9 [IU]/h via INTRAVENOUS
  Filled 2016-01-08: qty 2.5

## 2016-01-08 MED ORDER — DOCUSATE SODIUM 100 MG PO CAPS
100.0000 mg | ORAL_CAPSULE | Freq: Two times a day (BID) | ORAL | Status: DC
  Filled 2016-01-08 (×3): qty 1

## 2016-01-08 MED ORDER — POTASSIUM CHLORIDE 10 MEQ/100ML IV SOLN
10.0000 meq | INTRAVENOUS | Status: DC
  Filled 2016-01-08 (×4): qty 100

## 2016-01-08 NOTE — ED Triage Notes (Signed)
Patient reports that he has been vomiting this evening. Patient denies abd pain or diarrhea. Patient is a type 1 diabetic and has not checked his blood sugar today.

## 2016-01-08 NOTE — Progress Notes (Signed)
Spoke with MD Luberta MutterKonidena about latest BG being 89 and BMET results being within range. Luberta MutterKonidena stated to d/c insulin drip, I stated to do that I would have to administer basal insulin 2 hours prior to discontinuing insulin drip. Luberta MutterKonidena stated she would contact diabetes coordinator and have new orders in shortly. Will continue to monitor patient.

## 2016-01-08 NOTE — Progress Notes (Signed)
25 y/o M on Lovenox 30 mg daily for DVT prophylaxis.   Wt: 74.8 kg, CrCl: 94.1 ml/min  Will increase Lovenox to 40 mg daily.   Luisa HartScott Teka Chanda, PharmD Clinical Pharmacist

## 2016-01-08 NOTE — ED Notes (Signed)
Glucose stabilizer initiated and insulin drip started; verified also by April B, RN

## 2016-01-08 NOTE — ED Notes (Signed)
Pt talking on phone; says pain is better; HR 130; no requests at this time

## 2016-01-08 NOTE — H&P (Signed)
[facility] [place] at [facility]   PATIENT NAME: [name]    MR#:  [id number]  DATE OF BIRTH:  [birth year]  DATE OF ADMISSION:  01/08/2016  PRIMARY CARE PHYSICIAN: No PCP Per Patient   REQUESTING/REFERRING PHYSICIAN: Dr. Lucrezia Europe     Chief Complaint  Patient presents with  . Emesis  . Abdominal Pain  . Hyperglycemia    HISTORY OF PRESENT ILLNESS:  Herby Amick  is a 25 y.o. male with a known history of   25 year old male patient with history of type 1 diabetes mellitus for the past 12 years comes in because of nausea, vomiting since yesterday afternoon. Patient drawn out of supplies for checking the blood glucose, he is out of lancets so he was giving insulin without checking the blood sugars. According to the patient's mother has been having nausea, vomiting since yesterday evening. Also had midepigastric abdominal pain. According to the patient and he took 30 units of Humalog at around 2:20 AM. Blood sugar this morning and he came it was 386. No fever but cough and phlegm feling choking sensation,    PAST MEDICAL HISTORY:   Past Medical History:  Diagnosis Date  . Diabetes mellitus without complication (HCC)    Type 1  . DKA, type 1 (HCC)     PAST SURGICAL HISTOIRY:  No past surgical history on file.  SOCIAL HISTORY:   Social History  Substance Use Topics  . Smoking status: Current Every Day Smoker  . Smokeless tobacco: Never Used  . Alcohol use Not on file    FAMILY HISTORY:   Family History  Problem Relation Age of Onset  . Diabetes Mother   . Hypertension Neg Hx   . Heart disease Neg Hx     DRUG ALLERGIES:   Allergies  Allergen Reactions  . Benadryl [Diphenhydramine] Other (See Comments)    hyperactive    REVIEW OF SYSTEMS:  CONSTITUTIONAL: No fever, fatigue or weakness.  EYES: No blurred or double vision.  EARS, NOSE, AND THROAT: No tinnitus or ear pain.  RESPIRATORY: Cough present  CARDIOVASCULAR:  No chest pain, orthopnea, edema.  GASTROINTESTINAL: Nausea, midepigastric abdominal pain.  GENITOURINARY: No dysuria, hematuria.  ENDOCRINE: No polyuria, nocturia,  HEMATOLOGY: No anemia, easy bruising or bleeding SKIN: No rash or lesion. MUSCULOSKELETAL: No joint pain or arthritis.   NEUROLOGIC: No tingling, numbness, weakness.  PSYCHIATRY: No anxiety or depression.   MEDICATIONS AT HOME:   Prior to Admission medications   Medication Sig Start Date End Date Taking? Authorizing Provider  insulin detemir (LEVEMIR) 100 UNIT/ML injection Inject 40 Units into the skin at bedtime. Adjust as needed.    Yes Historical Provider, MD  insulin lispro (HUMALOG) 100 UNIT/ML injection Inject 10-30 Units into the skin 3 (three) times daily with meals. Take 10 units if blood sugar is less than 200. For 200 - 250 add 2 units. For each additional 50, add 2 more units.   Yes Historical Provider, MD  omeprazole (PRILOSEC OTC) 20 MG tablet Take 20 mg by mouth daily.   Yes Historical Provider, MD      VITAL SIGNS:  Blood pressure 96/64, pulse (!) 127, temperature 97.9 F (36.6 C), temperature source Oral, resp. rate 19, height 5\' 9"  (1.753 m), weight 74.8 kg (165 lb), SpO2 99 %.  PHYSICAL EXAMINATION:  GENERAL:  25 y.o.-year-old patient lying in the bed with no acute distress.  EYES: Pupils equal, round, reactive to light and accommodation. No scleral icterus.  Extraocular muscles intact.  HEENT: Head atraumatic, normocephalic. Oropharynx and nasopharynx clear.  NECK:  Supple, no jugular venous distention. No thyroid enlargement, no tenderness.  LUNGS: Normal breath sounds bilaterally, no wheezing, rales,rhonchi or crepitation. No use of accessory muscles of respiration.  CARDIOVASCULAR: S1, S2 normal. No murmurs, rubs, or gallops.  ABDOMEN: Soft, nontender, nondistended. Bowel sounds present. No organomegaly or mass.  EXTREMITIES: No pedal edema, cyanosis, or clubbing.  NEUROLOGIC: Cranial nerves II  through XII are intact. Muscle strength 5/5 in all extremities. Sensation intact. Gait not checked.  PSYCHIATRIC: The patient is alert and oriented x 3.  SKIN: No obvious rash, lesion, or ulcer.   LABORATORY PANEL:   CBC  Recent Labs Lab 01/08/16 0448  WBC 19.8*  HGB 17.3  HCT 50.7  PLT 310   ------------------------------------------------------------------------------------------------------------------  Chemistries   Recent Labs Lab 01/08/16 0448 01/08/16 0816  NA 136 139  K 3.5 3.8  CL 90* 101  CO2 14* 22  GLUCOSE 361* 150*  BUN 35* 28*  CREATININE 1.93* 1.21  CALCIUM 9.4 8.4*  MG  --  1.9  AST 37  --   ALT 28  --   ALKPHOS 113  --   BILITOT 4.1*  --    ------------------------------------------------------------------------------------------------------------------  Cardiac Enzymes No results for input(s): TROPONINI in the last 168 hours. ------------------------------------------------------------------------------------------------------------------  RADIOLOGY:  Dg Chest 1 View  Result Date: 01/08/2016 CLINICAL DATA:  Epigastric pain and vomiting.  Cough and fever. EXAM: CHEST 1 VIEW COMPARISON:  05/15/2014. FINDINGS: Trachea is midline. Heart size normal. Lungs are clear. No pleural fluid. IMPRESSION: Negative. Electronically Signed   By: Leanna BattlesMelinda  Blietz M.D.   On: 01/08/2016 08:47   [place] Abdomen Limited Ruq  Result Date: 01/08/2016 CLINICAL DATA:  Epigastric pain and vomiting for 8 hours. EXAM: US ABDOMEN LIMITED - RIGHT UPPER QUADRANT COMPARISON:  None. FINDINGS: Gallbladder: No gallstones or wall thickening visualized. No sonographic Murphy sign noted by sonographer. Common bile duct: Diameter:  0.2 cm Liver: No focal lesion identified. Within normal limits in parenchymal echogenicity. Prominence of the portal vein is unchanged. IMPRESSION: No acute finding.  Negative for gallstones. Prominence of the portal vein is unchanged and of uncertain clinical  significance. Electronically Signed   By: Drusilla Kannerhomas  Dalessio M.D.   On: 01/08/2016 07:36    EKG:   Orders placed or performed during the hospital encounter of 01/08/16  . EKG 12-Lead  . EKG 12-Lead  Is tachycardia with 1 50 bpm  IMPRESSION AND PLAN:  #181. 25 year old male patient intractable nausea, vomiting with evidence of DKA with a pH 7.30, anion gap 32 with acute renal failure, blood sugar more than 300 on arrival.   #1 DKA; in patient with type 1 diabetes mellitus: Admitted to intensive care unit, started on IV insulin drip, IV hydration, check the med B every 6 hours, continue aggressive hydration and see how he does. #2 jaundice with elevated TB;4.1, normal lipase, normal liver function/  Reactive jaundice due to DKA?likely secondary to epigastric pain and gastritis: Continue IV PPIs. Dr. ultrasound of abdomen did not show any gallstones. Follow the trend, obtain CAT scan of abdomen if needed.  #3 nausea, vomiting, abdominal pain due to DKA: Continue IV hydration, IV PPIs, IV Zofran.  #4 elevated white count ;likely reactive/: Check the white count tomorrow. No need for antibiotics at this time .  All the records are reviewed and case discussed with ED provider. Management plans discussed with the patient, family and they are in  agreement.  CODE STATUS: full  Discussed with the patient's mother  TOTAL TIME TAKING CARE OF THIS PATIENT:55 minutes.    Katha Hamming M.D on 01/08/2016 at 9:08 AM  Between 7am to 6pm - Pager - [phone]  After 6pm go to [email or web address] - password [email or web address] [facility]  [place] [facility]  Office  [phone]  CC: Primary care physician; No PCP Per Patient  Note: This dictation was prepared with Dragon dictation along with smaller phrase technology. Any transcriptional errors that result from this process are unintentional.

## 2016-01-08 NOTE — ED Notes (Signed)
AAO x 3. Skin warm and dry. Resting. No acute distress. Transferred to ICU.

## 2016-01-08 NOTE — ED Provider Notes (Signed)
[facility] Emergency Department Provider Note   ____________________________________________   First MD Initiated Contact with Patient 01/08/16 [phone]     (approximate)  I have reviewed the triage vital signs and the nursing notes.   HISTORY  Chief Complaint Emesis; Abdominal Pain; and Hyperglycemia    HPI [name] is a 25 y.o. male who comes into the hospital today not feeling well. The patient has been vomiting. He started yesterday morning. He reports that his emesis looked like coffee. It's been brownish blackish. He vomited several times and is unable to determine exactly how many. The patient reports that he can't keep anything down. He denies any diarrhea. The patient has a history of type 1 diabetes and reports he has not taken his blood sugars today. He has been taking his Humalog all day and reports that his last dose was 30 units at 2:20 AM. The patient reports that he usually takes it on a sliding scale but assumed it was high so he took a large dose. The patient has felt feverish but has not taken his temperature. He has had some epigastric pain and feels as though he is being a little choked. This pain is burning in his chest. The patient rates his pain off the scale at this time and says is a 10 out of 10 in intensity. He is here for evaluation today.   Past Medical History:  Diagnosis Date  . Diabetes mellitus without complication (HCC)    Type 1  . DKA, type 1 [facility](HCC)     Patient Active Problem List   Diagnosis Date Noted  . Urinary retention 10/06/2014  . Hypokalemia 10/06/2014  . DKA, type 1 (HCC) 10/04/2014  . DKA (diabetic ketoacidoses) (HCC) 10/04/2014  . ARF (acute renal failure) (HCC) 10/04/2014  . Leukocytosis 10/04/2014    No past surgical history on file.  Prior to Admission medications   Medication Sig Start Date End Date Taking? Authorizing Provider  insulin detemir (LEVEMIR) 100 UNIT/ML injection Inject 40 Units  into the skin at bedtime. Adjust as needed.    Yes Historical Provider, MD  insulin lispro (HUMALOG) 100 UNIT/ML injection Inject 10-30 Units into the skin 3 (three) times daily with meals. Take 10 units if blood sugar is less than 200. For 200 - 250 add 2 units. For each additional 50, add 2 more units.   Yes Historical Provider, MD  omeprazole (PRILOSEC OTC) 20 MG tablet Take 20 mg by mouth daily.   Yes Historical Provider, MD    Allergies Benadryl [diphenhydramine]  Family History  Problem Relation Age of Onset  . Diabetes Mother   . Hypertension Neg Hx   . Heart disease Neg Hx     Social History Social History  Substance Use Topics  . Smoking status: Current Every Day Smoker  . Smokeless tobacco: Never Used  . Alcohol use Not on file    Review of Systems Constitutional: No fever/chills Eyes: No visual changes. ENT: No sore throat. Cardiovascular: Denies chest pain. Respiratory: Denies shortness of breath. Gastrointestinal:  abdominal pain.   nausea,  vomiting.  No diarrhea.  No constipation. Genitourinary: Negative for dysuria. Musculoskeletal: Negative for back pain. Skin: Negative for rash. Neurological: Negative for headaches, focal weakness or numbness.  10-point ROS otherwise negative.  ____________________________________________   PHYSICAL EXAM:  VITAL SIGNS: ED Triage Vitals  Enc Vitals Group     BP 01/08/16 0428 103/61     Pulse Rate 01/08/16 0428 (!) 155  Resp 01/08/16 0428 18     Temp 01/08/16 0428 97.9 F (36.6 C)     Temp Source 01/08/16 0428 Oral     SpO2 01/08/16 0428 98 %     Weight 01/08/16 0429 165 lb (74.8 kg)     Height 01/08/16 0429 5\' 9"  (1.753 m)     Head Circumference --      Peak Flow --      Pain Score 01/08/16 0429 10     Pain Loc --      Pain Edu? --      Excl. in GC? --     Constitutional: Alert and oriented. Well appearing and in Moderate distress. Eyes: Conjunctivae are normal. PERRL. EOMI. Head: Atraumatic. Nose:  No congestion/rhinnorhea. Mouth/Throat: Mucous membranes are moist.  Oropharynx non-erythematous. Cardiovascular: Tachycardia regular rhythm. Grossly normal heart sounds.  Good peripheral circulation. Respiratory: Normal respiratory effort.  No retractions. Lungs CTAB. Gastrointestinal: Soft with mild epigastric tenderness to palpation. No distention. Positive bowel sounds. Musculoskeletal: No lower extremity tenderness nor edema.   Neurologic:  Normal speech and language.  Skin:  Skin is warm, dry and intact.  Psychiatric: Mood and affect are normal.   ____________________________________________   LABS (all labs ordered are listed, but only abnormal results are displayed)  Labs Reviewed  GLUCOSE, CAPILLARY - Abnormal; Notable for the following:       Result Value   Glucose-Capillary 386 (*)    All other components within normal limits  CBC - Abnormal; Notable for the following:    WBC 19.8 (*)    All other components within normal limits  COMPREHENSIVE METABOLIC PANEL - Abnormal; Notable for the following:    Chloride 90 (*)    CO2 14 (*)    Glucose, Bld 361 (*)    BUN 35 (*)    Creatinine, Ser 1.93 (*)    Total Protein 8.5 (*)    Total Bilirubin 4.1 (*)    GFR calc non Af Amer 47 (*)    GFR calc Af Amer 54 (*)    Anion gap 32 (*)    All other components within normal limits  BLOOD GAS, VENOUS - Abnormal; Notable for the following:    pCO2, Ven 33 (*)    Bicarbonate 14.5 (*)    Acid-base deficit 11.6 (*)    All other components within normal limits  URINALYSIS COMPLETEWITH MICROSCOPIC ([facility] ONLY) - Abnormal; Notable for the following:    Color, Urine YELLOW (*)    APPearance CLEAR (*)    Glucose, UA >500 (*)    Ketones, ur 2+ (*)    Hgb urine dipstick 1+ (*)    Protein, ur 30 (*)    All other components within normal limits  GLUCOSE, CAPILLARY - Abnormal; Notable for the following:    Glucose-Capillary 245 (*)    All other components within normal limits    GLUCOSE, CAPILLARY - Abnormal; Notable for the following:    Glucose-Capillary 192 (*)    All other components within normal limits  LIPASE, BLOOD   ____________________________________________  EKG  ED ECG REPORT I, Rebecka Apley, the attending physician, personally viewed and interpreted this ECG.   Date: 01/08/2016  EKG Time: 438  Rate: 150  Rhythm: sinus tachycardia  Axis: Normal  Intervals:none  ST&T Change: none  ____________________________________________  RADIOLOGY  [place] abd RUQ ____________________________________________   PROCEDURES  Procedure(s) performed: None  Procedures  Critical Care performed: Yes, see critical care note(s)  CRITICAL CARE  Performed by: Lucrezia Europe P   Total critical care time: 45 minutes  Critical care time was exclusive of separately billable procedures and treating other patients.  Critical care was necessary to treat or prevent imminent or life-threatening deterioration.  Critical care was time spent personally by me on the following activities: development of treatment plan with patient and/or surrogate as well as nursing, discussions with consultants, evaluation of patient's response to treatment, examination of patient, obtaining history from patient or surrogate, ordering and performing treatments and interventions, ordering and review of laboratory studies, ordering and review of radiographic studies, pulse oximetry and re-evaluation of patient's condition.  ____________________________________________   INITIAL IMPRESSION / ASSESSMENT AND PLAN / ED COURSE  Pertinent labs & imaging results that were available during my care of the patient were reviewed by me and considered in my medical decision making (see chart for details).  This is a 25 year old male with a history of type 1 diabetes who comes into the hospital today with some abdominal pain and vomiting. The patient has not been checking his sugars all  day today. The patient is significantly tachycardic to 150 and he is in some pain. My concern is that the patient may be in DKA. I will give the patient liter of normal saline as well as some Zofran for his nausea. I will await the results of the patient's blood work and then reassess the patient.  Clinical Course  Value Comment By Time  US Abdomen Limited RUQ No acute finding. Negative for gallstones.  Prominence of the portal vein is unchanged and of uncertain clinical significance.   Rebecka Apley, MD 09/18 [phone]   It appears that the patient does have an elevated anion gap as well as DKA. The patient's pH is 7.25 but his bicarbonate was 14 and his anion gap is 32. I will start the patient on insulin drip. It does appear that the patient's bilirubin is 4.1 so I will also send the patient for an ultrasound of his gallbladder to look for biliary disease causing some of his symptoms. I did contact the intensive care unit for admission of the patient in DKA. He will receive a dose of morphine and a second liter of normal saline.  The patient ultrasound does not show any cause for his elevated bilirubin level. His gallbladder is unremarkable. The patient be admitted to the hospitalist service to the stepdown unit. The patient was placed on an insulin drip.  ____________________________________________   FINAL CLINICAL IMPRESSION(S) / ED DIAGNOSES  Final diagnoses:  Abdominal pain  Diabetic ketoacidosis without coma associated with type 1 diabetes mellitus (HCC)      NEW MEDICATIONS STARTED DURING THIS VISIT:  New Prescriptions   No medications on file     Note:  This document was prepared using Dragon voice recognition software and may include unintentional dictation errors.    Rebecka Apley, MD 01/08/16 [phone]

## 2016-01-08 NOTE — ED Notes (Signed)
C/O nausea. Dr. Don PerkingVeronese informed. Zofran ordered and administered, see MAR.  Continue to monitor.

## 2016-01-08 NOTE — ED Notes (Signed)
Patient denies pain and is resting comfortably.  

## 2016-01-08 NOTE — Progress Notes (Addendum)
Inpatient Diabetes Program Recommendations  AACE/ADA: New Consensus Statement on Inpatient Glycemic Control (2015)  Target Ranges:  Prepandial:   less than 140 mg/dL      Peak postprandial:   less than 180 mg/dL (1-2 hours)      Critically ill patients:  140 - 180 mg/dL   Results for PEARSE, SHIFFLER (MRN [id number]) as of 01/08/2016 08:42  Ref. Range 01/08/2016 04:48  Glucose Latest Ref Range: 65 - 99 mg/dL 409 (H)  Results for RAJAN, BURGARD (MRN [id number]) as of 01/08/2016 08:42  Ref. Range 01/08/2016 04:48  CO2 Latest Ref Range: 22 - 32 mmol/L 14 (L)  Results for DHILAN, BRAUER (MRN [id number]) as of 01/08/2016 08:42  Ref. Range 01/08/2016 04:48  Anion gap Latest Ref Range: 5 - 15  32 (H)   Review of Glycemic Control  Diabetes history: DM1 Outpatient Diabetes medications: Levemir 40 units QHS, Humalog 10-30 TID with meals (per home med list:Take 10 units if blood sugar is less than 200. For 200 - 250 add 2 units. For each additional 50, add 2 more units) Current orders for Inpatient glycemic control: IV insulin per DKA order set; on GlucoStabilizer  Inpatient Diabetes Program Recommendations: Insulin - IV drip/GlucoStabilizer: Patient is currently on IV insulin per GlucoStabilizer DKA order set. Patient should remain on IV insulin until acidosis is cleared (as determined by MD; CO2 >20, AG <10-12). NURSING, please be sure BMET is called to MD every Q4H.   NOTE: Once acidosis is cleared (per MD); patient will require basal, correction, and meal coverage insulin as he has Type 1 diabetes and makes no insulin. At time of transition from IV to SQ insulin, recommend ordering Levemir 22 units Q24H (based on 74 kg x 0.3 units), Novolog 0-9 units TID with meals, Novolog 0-5 units QHS, Novolog 4 units TID with meals for meal coverage if patient eats at least 50% of meals.  Addendum 01/08/16@14 :43-Spoke with patient and his mother about diabetes and home regimen for diabetes control. Patient  reports that he was diagnosed with DM1 when he was 25 years old. Patient's mother answering most questions for patient.  Patient use to see an Endocrinologist and use to be on an insulin pump in the past. Patient stopped using an insulin pump 1 year ago due to insurance not covering supplies. Patient no longer sees an Actor and is followed by PCP for diabetes management and currently he takes Levemir 40 units QHS and Humalog 10 units TID with meals plus additional units for correction as an outpatient for diabetes control. Patient is covered under his mother's insurance at [employer] and she reports that the copays for his insulins vary from $35-a little more than $100 but she is able to afford the insulins. Discussed insulin manufacture medication assistnace programs and encouraged patient to go on-line and print out the application for medication assistance for Levemir and Humalog and see if he can qualify for free insulin. Patient reports that he is taking Levemir insulin as prescribed and that takes at least Humalog 10 units with meals. Patient states that his glucose is usually in the 200's mg/dl most of the time. However, he has not been able to check glucose for last several days because he his lancet device got lost. Informed patient and his mother than a new lancet device can be purchased from [employer] for $5.84 (per [employer] hand out and verified on-line) but patient's mother is adamant that it is over $12 and she will purchase one  for him when she gets paid this Wednesday.  Provided handout with [employer] products and asked that she ask someone in her [facility] pharmacy about the lancet for $5.84. Patient's mother does state that the patient's father is suppose to be getting one and having [facility] [place] where she [place].  Discussed glucose and A1C goals. Discussed importance of checking CBGs and maintaining good CBG control to prevent long-term and short-term complications.  Explained how hyperglycemia leads to damage within blood vessels which lead to the common complications seen with uncontrolled diabetes. Stressed to the patient the importance of improving glycemic control to prevent further complications from uncontrolled diabetes. Discussed impact of nutrition, exercise, stress, sickness, and medications on diabetes control. Encouraged patient to check his glucose 4 times per day (before meals and at bedtime) and to keep a log book of glucose readings and insulin taken which he will need to take to doctor appointments. Explained how the doctor he follows up with can use the log book to continue to make insulin adjustments if needed. Patient verbalized understanding of information discussed and he states that he has no further questions at this time related to diabetes.  Thanks, Orlando PennerMarie Jadon Harbaugh, RN, MSN, CDE Diabetes Coordinator Inpatient Diabetes Program [phone] (Team Pager from 8am to 5pm) [phone] ([facility] office) [phone] [facility] office) [phone] [facility] office)

## 2016-01-08 NOTE — Progress Notes (Signed)
MD Luberta MutterKonidena was rounding in ICU and I mentioned that patient was requesting a diet order. MD stated to order a diet for patient. See new orders, will continue to monitor patient.

## 2016-01-08 NOTE — Progress Notes (Signed)
Spoke with MD Luberta MutterKonidena to clarify ordered potassium replacement with the patient's current potassium level at 3.8. MD stated to d/c potassium orders and not to administer. See new orders, will continue to monitor patient.

## 2016-01-09 LAB — COMPREHENSIVE METABOLIC PANEL
ALK PHOS: 76 U/L (ref 38–126)
ALT: 14 U/L — AB (ref 17–63)
AST: 14 U/L — ABNORMAL LOW (ref 15–41)
Albumin: 3.2 g/dL — ABNORMAL LOW (ref 3.5–5.0)
Anion gap: 7 (ref 5–15)
BILIRUBIN TOTAL: 2 mg/dL — AB (ref 0.3–1.2)
BUN: 9 mg/dL (ref 6–20)
CALCIUM: 8.1 mg/dL — AB (ref 8.9–10.3)
CO2: 24 mmol/L (ref 22–32)
CREATININE: 0.81 mg/dL (ref 0.61–1.24)
Chloride: 105 mmol/L (ref 101–111)
Glucose, Bld: 198 mg/dL — ABNORMAL HIGH (ref 65–99)
Potassium: 3.8 mmol/L (ref 3.5–5.1)
Sodium: 136 mmol/L (ref 135–145)
TOTAL PROTEIN: 5.7 g/dL — AB (ref 6.5–8.1)

## 2016-01-09 LAB — GLUCOSE, CAPILLARY
GLUCOSE-CAPILLARY: 42 mg/dL — AB (ref 65–99)
GLUCOSE-CAPILLARY: 103 mg/dL — AB (ref 65–99)
GLUCOSE-CAPILLARY: 135 mg/dL — AB (ref 65–99)
GLUCOSE-CAPILLARY: 186 mg/dL — AB (ref 65–99)
Glucose-Capillary: 50 mg/dL — ABNORMAL LOW (ref 65–99)
Glucose-Capillary: 110 mg/dL — ABNORMAL HIGH (ref 65–99)
Glucose-Capillary: 194 mg/dL — ABNORMAL HIGH (ref 65–99)

## 2016-01-09 LAB — BASIC METABOLIC PANEL
ANION GAP: 5 (ref 5–15)
Anion gap: 8 (ref 5–15)
BUN: 12 mg/dL (ref 6–20)
BUN: 14 mg/dL (ref 6–20)
CALCIUM: 8.4 mg/dL — AB (ref 8.9–10.3)
CALCIUM: 8.3 mg/dL — AB (ref 8.9–10.3)
CHLORIDE: 107 mmol/L (ref 101–111)
CO2: 23 mmol/L (ref 22–32)
CO2: 24 mmol/L (ref 22–32)
CREATININE: 0.85 mg/dL (ref 0.61–1.24)
CREATININE: 0.84 mg/dL (ref 0.61–1.24)
Chloride: 108 mmol/L (ref 101–111)
GFR calc Af Amer: >60 mL/min (ref >60)
GFR calc non Af Amer: >60 mL/min (ref >60)
Glucose, Bld: 44 mg/dL — CL (ref 65–99)
Glucose, Bld: 57 mg/dL — ABNORMAL LOW (ref 65–99)
POTASSIUM: 3.2 mmol/L — AB (ref 3.5–5.1)
Potassium: 3.4 mmol/L — ABNORMAL LOW (ref 3.5–5.1)
SODIUM: 136 mmol/L (ref 135–145)
SODIUM: 139 mmol/L (ref 135–145)

## 2016-01-09 LAB — HEMOGLOBIN A1C
Hgb A1c MFr Bld: 10.4 % — ABNORMAL HIGH (ref 4.8–5.6)
Mean Plasma Glucose: 252 mg/dL

## 2016-01-09 MED ORDER — POTASSIUM CHLORIDE IN NACL 20-0.9 MEQ/L-% IV SOLN
INTRAVENOUS | Status: DC
  Administered 2016-01-09 – 2016-01-10 (×2): via INTRAVENOUS
  Filled 2016-01-09 (×3): qty 1000

## 2016-01-09 MED ORDER — METOCLOPRAMIDE HCL 5 MG/ML IJ SOLN
5.0000 mg | Freq: Three times a day (TID) | INTRAMUSCULAR | Status: DC
  Administered 2016-01-09 – 2016-01-10 (×3): 5 mg via INTRAVENOUS
  Filled 2016-01-09 (×3): qty 2

## 2016-01-09 MED ORDER — INSULIN DETEMIR 100 UNIT/ML ~~LOC~~ SOLN
18.0000 [IU] | SUBCUTANEOUS | Status: DC
Start: 2016-01-09 — End: 2016-01-10
  Administered 2016-01-09: 18 [IU] via SUBCUTANEOUS
  Filled 2016-01-09 (×3): qty 0.18

## 2016-01-09 NOTE — Progress Notes (Signed)
Morning blood sugar59mg /dl.offered OJ. Pt resting comforatably.VSS.Re assess after 15mts. bloodsugar 110mg /dl. Will continue to observe closely. MOM at bed side.

## 2016-01-09 NOTE — Progress Notes (Addendum)
Inpatient Diabetes Program Recommendations  AACE/ADA: New Consensus Statement on Inpatient Glycemic Control (2015)  Target Ranges:  Prepandial:   less than 140 mg/dL      Peak postprandial:   less than 180 mg/dL (1-2 hours)      Critically ill patients:  140 - 180 mg/dL   Lab Results  Component Value Date   GLUCAP 110 (H) 01/09/2016   HGBA1C 10.4 (H) 01/08/2016    Review of Glycemic Control  Diabetes history: DM1 Outpatient Diabetes medications: Levemir 40 units QHS, Humalog 10-30 TID with meals (per home med list:Take 10 units if blood sugar is less than 200. For 200 - 250 add 2 units. For each additional 50, add 2 more units)  Current orders for Inpatient glycemic control: Novolog 0-9 tid, Novolog0-5 qhs, Levemir 22 units qhs  Inpatient Diabetes Program Recommendations:   Patient  will require basal, correction, and meal coverage insulin as he has Type 1 diabetes and makes no insulin.   Consider decreasing Levemir insulin to 18 units qday @ 1900  Please add Novolog 4 units TID with meals for meal coverage if patient eats at least 50% of meals.  Susette RacerJulie Marcos Peloso, RN, BA, MHA, CDE Diabetes Coordinator Inpatient Diabetes Program  [phone] (Team Pager) [phone] [facility] Office) 01/09/2016 7:47 AM

## 2016-01-09 NOTE — Progress Notes (Signed)
[facility] [place] [facility]   PATIENT NAME: [name]    MR#:  [id number]  DATE OF BIRTH:  09/15/1990  SUBJECTIVE:pt seen at bedside.admitted for DKA.now off insulin drip.nausea improved.but has mid epigasric abdominal pain.  CHIEF COMPLAINT:   Chief Complaint  Patient presents with  . Emesis  . Abdominal Pain  . Hyperglycemia    REVIEW OF SYSTEMS:   ROS CONSTITUTIONAL: No fever, fatigue or weakness.  EYES: No blurred or double vision.  EARS, NOSE, AND THROAT: No tinnitus or ear pain.  RESPIRATORY: No cough, shortness of breath, wheezing or hemoptysis.  CARDIOVASCULAR: No chest pain, orthopnea, edema.  GASTROINTESTINAL: nausea,midepigastric abdominal pain GENITOURINARY: No dysuria, hematuria.  ENDOCRINE: No polyuria, nocturia,  HEMATOLOGY: No anemia, easy bruising or bleeding SKIN: No rash or lesion. MUSCULOSKELETAL: No joint pain or arthritis.   NEUROLOGIC: No tingling, numbness, weakness.  PSYCHIATRY: No anxiety or depression.   DRUG ALLERGIES:   Allergies  Allergen Reactions  . Benadryl [Diphenhydramine] Other (See Comments)    hyperactive    VITALS:  Blood pressure 120/82, pulse 81, temperature 97.9 F (36.6 C), temperature source Oral, resp. rate 13, height 5\' 9"  (1.753 m), weight 74.8 kg (165 lb), SpO2 100 %.  PHYSICAL EXAMINATION:  GENERAL:  25 y.o.-year-old patient lying in the bed with no acute distress.  EYES: Pupils equal, round, reactive to light and accommodation. No scleral icterus. Extraocular muscles intact.  HEENT: Head atraumatic, normocephalic. Oropharynx and nasopharynx clear.  NECK:  Supple, no jugular venous distention. No thyroid enlargement, no tenderness.  LUNGS: Normal breath sounds bilaterally, no wheezing, rales,rhonchi or crepitation. No use of accessory muscles of respiration.  CARDIOVASCULAR: S1, S2 normal. No murmurs, rubs, or gallops.  ABDOMEN: Soft, nontender, nondistended. Bowel sounds  present. No organomegaly or mass. Mild mid epigastric tenderness presnet. EXTREMITIES: No pedal edema, cyanosis, or clubbing.  NEUROLOGIC: Cranial nerves II through XII are intact. Muscle strength 5/5 in all extremities. Sensation intact. Gait not checked.  PSYCHIATRIC: The patient is alert and oriented x 3.  SKIN: No obvious rash, lesion, or ulcer.    LABORATORY PANEL:   CBC  Recent Labs Lab 01/08/16 0448  WBC 19.8*  HGB 17.3  HCT 50.7  PLT 310   ------------------------------------------------------------------------------------------------------------------  Chemistries   Recent Labs Lab 01/08/16 0816  01/09/16 1542  NA 139  < > 136  K 3.8  < > 3.8  CL 101  < > 105  CO2 22  < > 24  GLUCOSE 150*  < > 198*  BUN 28*  < > 9  CREATININE 1.21  < > 0.81  CALCIUM 8.4*  < > 8.1*  MG 1.9  --   --   AST  --   --  14*  ALT  --   --  14*  ALKPHOS  --   --  76  BILITOT  --   --  2.0*  < > = values in this interval not displayed. ------------------------------------------------------------------------------------------------------------------  Cardiac Enzymes No results for input(s): TROPONINI in the last 168 hours. ------------------------------------------------------------------------------------------------------------------  RADIOLOGY:  Dg Chest 1 View  Result Date: 01/08/2016 CLINICAL DATA:  Epigastric pain and vomiting.  Cough and fever. EXAM: CHEST 1 VIEW COMPARISON:  05/15/2014. FINDINGS: Trachea is midline. Heart size normal. Lungs are clear. No pleural fluid. IMPRESSION: Negative. Electronically Signed   By: Leanna BattlesMelinda  Blietz M.D.   On: 01/08/2016 08:47   [place] Abdomen Limited Ruq  Result Date: 01/08/2016 CLINICAL DATA:  Epigastric pain  and vomiting for 8 hours. EXAM: US ABDOMEN LIMITED - RIGHT UPPER QUADRANT COMPARISON:  None. FINDINGS: Gallbladder: No gallstones or wall thickening visualized. No sonographic Murphy sign noted by sonographer. Common bile duct:  Diameter:  0.2 cm Liver: No focal lesion identified. Within normal limits in parenchymal echogenicity. Prominence of the portal vein is unchanged. IMPRESSION: No acute finding.  Negative for gallstones. Prominence of the portal vein is unchanged and of uncertain clinical significance. Electronically Signed   By: Drusilla Kanner M.D.   On: 01/08/2016 07:36    EKG:   Orders placed or performed during the hospital encounter of 01/08/16  . EKG 12-Lead  . EKG 12-Lead    ASSESSMENT AND PLAN:  1.DKA;RESOLVED.continue iv fluids,had  Hypoglycemia;imprved.novolog 4 units  TID and  Decrease levemir to 18 units QHS.;  2.acute epi gastritis;improving with iv ppi, 3.elavted total bili;improving 4.nausea,vomiting;resolving D/w mother D/w RN likely d/c home tomorrow.    All the records are reviewed and case discussed with Care Management/Social Workerr. Management plans discussed with the patient, family and they are in agreement.  CODE STATUS:FULL  TOTAL TIME TAKING CARE OF THIS PATIENT: 35 minutes.   POSSIBLE D/C IN 1-2 DAYS, DEPENDING ON CLINICAL CONDITION.   Katha Hamming M.D on 01/09/2016 at 7:54 PM  Between 7am to 6pm - Pager - [phone]  After 6pm go to [email or web address] - password [email or web address] [facility]  [facility]  Office  [phone]  CC: Primary care physician; No PCP Per Patient   Note: This dictation was prepared with Dragon dictation along with smaller phrase technology. Any transcriptional errors that result from this process are unintentional.

## 2016-01-10 LAB — GLUCOSE, CAPILLARY
GLUCOSE-CAPILLARY: 216 mg/dL — AB (ref 65–99)
GLUCOSE-CAPILLARY: 122 mg/dL — AB (ref 65–99)
Glucose-Capillary: 56 mg/dL — ABNORMAL LOW (ref 65–99)
Glucose-Capillary: 176 mg/dL — ABNORMAL HIGH (ref 65–99)

## 2016-01-10 MED ORDER — METOCLOPRAMIDE HCL 5 MG PO TABS: 5.0000 mg | ORAL_TABLET | Freq: Four times a day (QID) | ORAL | 0 refills | Status: DC

## 2016-01-10 MED ORDER — FAMOTIDINE 20 MG PO TABS: 20.0000 mg | ORAL_TABLET | Freq: Two times a day (BID) | ORAL | 0 refills | Status: DC

## 2016-01-10 MED ORDER — FAMOTIDINE 20 MG PO TABS
20.0000 mg | ORAL_TABLET | Freq: Two times a day (BID) | ORAL | Status: DC
  Administered 2016-01-10: 20 mg via ORAL
  Filled 2016-01-10: qty 1

## 2016-01-10 MED ORDER — INSULIN DETEMIR 100 UNIT/ML ~~LOC~~ SOLN: 18.0000 [IU] | SUBCUTANEOUS | 11 refills | Status: DC

## 2016-01-10 NOTE — Progress Notes (Signed)
Discharge instructions given and went over with patient and mother at bedside. Prescriptions called into pharmacy. All questions answered. Patient and mother verbalized understanding. Patient discharged home. Bo McclintockBrewer,Lindsi Bayliss S, RN

## 2016-01-10 NOTE — Discharge Summary (Signed)
[name], is a 25 y.o. male  DOB 09/15/1990  MRN [id number].  Admission date:  01/08/2016  Admitting Physician  Katha HammingSnehalatha Stormy Connon, MD  Discharge Date:  01/10/2016   Primary MD  No PCP Per Patient  Recommendations for primary care physician for things to follow:  . [facility]   Admission Diagnosis  Cough [R05] Abdominal pain [R10.9] Diabetic ketoacidosis without coma associated with type 1 diabetes mellitus (HCC) [E10.10]   Discharge Diagnosis  Cough [R05] Abdominal pain [R10.9] Diabetic ketoacidosis without coma associated with type 1 diabetes mellitus (HCC) [E10.10]    Active Problems:   DKA (diabetic ketoacidoses) (HCC)      Past Medical History:  Diagnosis Date  . Diabetes mellitus without complication (HCC)    Type 1  . DKA, type 1 (HCC)     No past surgical history on file.     History of present illness and  Hospital Course:     Kindly see H&P for history of present illness and admission details, please review complete Labs, Consult reports and Test reports for all details in brief  HPI  from the history and physical done on the day of admission 25 year old male patient with history of type 1 diabetes mellitus came in because of intractable nausea, vomiting, epigastric abdominal pain. Admitted to ICU because of DKA with anion gap 32, acute renal failure, blood glucose more than 300, pH 7.3.   Hospital Course  #1 DKA in a patient with type 1 diabetes mellitus: Admitted to intensive care unit, started on IV insulin drip, IV hydration, IV nausea medicine and IV PPIs. Patient improved nicely, anion gap closed, renal failure improved. Patient nausea, vomiting also improved. Started on the diet. Patient is seen by diabetes coordinator,patient already aware of checking the  sugars. Patient used to see endocrinologist and the patient used to be on insulin pump but stopped using it.due to the cost. Patient has not been able to check glucose for several days because his lancet device  Is lost. Explained patient needs to lancet devices and that needs to be purchased at [employer]. And mother is involved in this decision. And the importance of checking blood glucoses and maintaining glucose control to prevent long-term and short-term complications are explained. Patient is on Lantus at this time for discharge him home with Lantus 14 units every 24 hours, patient can take NovoLog 3 units 3 times a day with meals. Episode of hypoglycemia with sugar 56 this morning. Patient to Lantus has been decreased from 18 units to 14 units because of that. 2. elevated bilirubin up to 4 without evidence of gallbladder stones. Patient total bilirubin decreased to 2 on further lab test. Patient denies any abdominal pain. Nausea, vomiting improved. Patient is given Reglan and also to help with nausea. Discharging home with Reglan. Patient can take Reglan 5 mg with each meal. Patient also has GERD and he takes PPIs at home. Advised him to continue that. The plan with patient and patient's mother. And also discussed with patient registered nurse. They all agreed.   Discharge Condition: stable   Follow UP      Discharge Instructions  and  Discharge Medications        Medication List    TAKE these medications   famotidine 20 MG tablet Commonly known as:  PEPCID Take 1 tablet (20 mg total) by mouth 2 (two) times daily.   insulin detemir 100 UNIT/ML injection Commonly known as:  LEVEMIR  Inject 0.18 mLs (18 Units total) into the skin daily. Adjust as needed. What changed:  how much to take  when to take this   insulin lispro 100 UNIT/ML injection Commonly known as:  HUMALOG Inject 10-30 Units into the skin 3 (three) times daily with meals. Take 10 units if blood sugar is less than  200. For 200 - 250 add 2 units. For each additional 50, add 2 more units.   metoCLOPramide 5 MG tablet Commonly known as:  REGLAN Take 1 tablet (5 mg total) by mouth 4 (four) times daily.   omeprazole 20 MG tablet Commonly known as:  PRILOSEC OTC Take 20 mg by mouth daily.         Diet and Activity recommendation: See Discharge Instructions above   Consults obtained - diabetes co ordinator   Major procedures and Radiology Reports - PLEASE review detailed and final reports for all details, in brief -      Dg Chest 1 View  Result Date: 01/08/2016 CLINICAL DATA:  Epigastric pain and vomiting.  Cough and fever. EXAM: CHEST 1 VIEW COMPARISON:  05/15/2014. FINDINGS: Trachea is midline. Heart size normal. Lungs are clear. No pleural fluid. IMPRESSION: Negative. Electronically Signed   By: Leanna Battles M.D.   On: 01/08/2016 08:47   US Abdomen Limited Ruq  Result Date: 01/08/2016 CLINICAL DATA:  Epigastric pain and vomiting for 8 hours. EXAM: US ABDOMEN LIMITED - RIGHT UPPER QUADRANT COMPARISON:  None. FINDINGS: Gallbladder: No gallstones or wall thickening visualized. No sonographic Murphy sign noted by sonographer. Common bile duct: Diameter:  0.2 cm Liver: No focal lesion identified. Within normal limits in parenchymal echogenicity. Prominence of the portal vein is unchanged. IMPRESSION: No acute finding.  Negative for gallstones. Prominence of the portal vein is unchanged and of uncertain clinical significance. Electronically Signed   By: Drusilla Kanner M.D.   On: 01/08/2016 07:36    Micro Results     Recent Results (from the past 240 hour(s))  MRSA PCR Screening     Status: None   Collection Time: 01/08/16  9:15 AM  Result Value Ref Range Status   MRSA by PCR NEGATIVE NEGATIVE Final    Comment:        The GeneXpert MRSA Assay (FDA approved for NASAL specimens only), is one component of a comprehensive MRSA colonization surveillance program. It is not intended to  diagnose MRSA infection nor to guide or monitor treatment for MRSA infections.        Today   Subjective:   [name] today has no headache,no chest abdominal pain,no new weakness tingling or numbness, feels much better wants to go home today.   Objective:   Blood pressure 115/77, pulse 89, temperature 97.8 F (36.6 C), temperature source Oral, resp. rate 14, height 5\' 9"  (1.753 m), weight 60 kg (132 lb 3.2 oz), SpO2 100 %.   Intake/Output Summary (Last 24 hours) at 01/10/16 1212 Last data filed at 01/10/16 1019  Gross per 24 hour  Intake             1925 ml  Output              550 ml  Net             1375 ml    Exam Awake Alert, Oriented x 3, No new F.N deficits, Normal affect [place].AT,PERRAL Supple Neck,No JVD, No cervical lymphadenopathy appriciated.  Symmetrical Chest wall movement, Good air movement bilaterally, CTAB RRR,No Gallops,Rubs or new  Murmurs, No Parasternal Heave +ve B.Sounds, Abd Soft, Non tender, No organomegaly appriciated, No rebound -guarding or rigidity. No Cyanosis, Clubbing or edema, No new Rash or bruise  Data Review   CBC w Diff: Lab Results  Component Value Date   WBC 19.8 (H) 01/08/2016   HGB 17.3 01/08/2016   HGB 15.2 05/13/2014   HCT 50.7 01/08/2016   HCT 51.3 05/13/2014   PLT 310 01/08/2016   PLT 315 05/13/2014   LYMPHOPCT 7 10/04/2014   LYMPHOPCT 15.7 12/21/2013   MONOPCT 3 10/04/2014   MONOPCT 9.2 12/21/2013   EOSPCT 1 10/04/2014   EOSPCT 0.2 12/21/2013   BASOPCT 0 10/04/2014   BASOPCT 0.5 12/21/2013    CMP: Lab Results  Component Value Date   NA 136 01/09/2016   NA 140 05/16/2014   K 3.8 01/09/2016   K 3.5 05/16/2014   CL 105 01/09/2016   CL 107 05/16/2014   CO2 24 01/09/2016   CO2 28 05/16/2014   BUN 9 01/09/2016   BUN 8 05/16/2014   CREATININE 0.81 01/09/2016   CREATININE 0.81 05/16/2014   PROT 5.7 (L) 01/09/2016   PROT 7.5 05/13/2014   ALBUMIN 3.2 (L) 01/09/2016   ALBUMIN 3.9 05/13/2014   BILITOT 2.0  (H) 01/09/2016   BILITOT 0.6 05/13/2014   ALKPHOS 76 01/09/2016   ALKPHOS 264 (H) 05/13/2014   AST 14 (L) 01/09/2016   AST 30 05/13/2014   ALT 14 (L) 01/09/2016   ALT 28 05/13/2014  .   Total Time in preparing paper work, data evaluation and todays exam - 35 minutes  Shakiya Mcneary M.D on 01/10/2016 at 12:12 PM    Note: This dictation was prepared with Dragon dictation along with smaller phrase technology. Any transcriptional errors that result from this process are unintentional.

## 2016-01-10 NOTE — Progress Notes (Signed)
CONCERNING: IV to Oral Route Change Policy  RECOMMENDATION: This patient is receiving famotidine by the intravenous route.  Based on criteria approved by the Pharmacy and Therapeutics Committee, the intravenous medication(s) is/are being converted to the equivalent oral dose form(s).   DESCRIPTION: These criteria include:  The patient is eating (either orally or via tube) and/or has been taking other orally administered medications for a least 24 hours  The patient has no evidence of active gastrointestinal bleeding or impaired GI absorption (gastrectomy, short bowel, patient on TNA or NPO).  If you have questions about this conversion, please contact the Pharmacy Department  []   [phone]( [phone] )  [facility] [x]   [phone]( [phone] )  [facility] []   [phone]( [phone] )  [facility] []   [phone]( [phone]  [facility] []   [phone]( [phone] )  [facility]   Emaree Chiu L, [facility]RPH 01/10/2016 9:34 AM

## 2016-01-10 NOTE — Progress Notes (Signed)
Inpatient Diabetes Program Recommendations  AACE/ADA: New Consensus Statement on Inpatient Glycemic Control (2015)  Target Ranges:  Prepandial:   less than 140 mg/dL      Peak postprandial:   less than 180 mg/dL (1-2 hours)      Critically ill patients:  140 - 180 mg/dL    Review of Glycemic Control  Diabetes history: DM1 Outpatient Diabetes medications: Levemir 40 units QHS, Humalog 10-30 TID with meals (per home med list:Take 10 units if blood sugar is less than 200. For 200 - 250 add 2 units. For each additional 50, add 2 more units) Current orders for Inpatient glycemic control: Levemir 18 units Q24H, Novolog 0-9 units TID with meals, Novolog 0-5 units QHS  Inpatient Diabetes Program Recommendations: Insulin - Basal: Fasting glucose 56 mg/dl today. Please consider decreasing Levemir to 14 units Q24H. Insulin - Meal Coverage: Patient will likely need meal coverage if eating at least 50% of meals. If MD orders meal coverage, recommend Novolog 3 units TID with meals if patient eats at least 50% of meals.  Thanks, Orlando PennerMarie Dwayne Begay, RN, MSN, CDE Diabetes Coordinator Inpatient Diabetes Program [phone] (Team Pager from 8am to 5pm) [phone] ([facility] office) [phone] [facility] office) [phone] [facility] office)

## 2016-10-09 ENCOUNTER — Emergency Department
Admission: EM | Admit: 2016-10-09 | Discharge: 2016-10-09 | Disposition: A | Discharge status: Home / Self Care | Payer: BLUE CROSS/BLUE SHIELD | Attending: Student in an Organized Health Care Education/Training Program | Admitting: Student in an Organized Health Care Education/Training Program

## 2016-10-09 DIAGNOSIS — Z79899 Other long term (current) drug therapy: Secondary | ICD-10-CM | POA: Diagnosis not present

## 2016-10-09 DIAGNOSIS — E1065 Type 1 diabetes mellitus with hyperglycemia: Secondary | ICD-10-CM | POA: Diagnosis not present

## 2016-10-09 DIAGNOSIS — F17200 Nicotine dependence, unspecified, uncomplicated: Secondary | ICD-10-CM | POA: Diagnosis not present

## 2016-10-09 DIAGNOSIS — R739 Hyperglycemia, unspecified: Secondary | ICD-10-CM

## 2016-10-09 DIAGNOSIS — L03115 Cellulitis of right lower limb: Secondary | ICD-10-CM | POA: Insufficient documentation

## 2016-10-09 DIAGNOSIS — L539 Erythematous condition, unspecified: Secondary | ICD-10-CM | POA: Diagnosis present

## 2016-10-09 LAB — COMPREHENSIVE METABOLIC PANEL
ALBUMIN: 3.8 g/dL (ref 3.5–5.0)
ALK PHOS: 144 U/L — AB (ref 38–126)
ALT: 12 U/L — ABNORMAL LOW (ref 17–63)
ANION GAP: 9 (ref 5–15)
AST: 15 U/L (ref 15–41)
BILIRUBIN TOTAL: 1.4 mg/dL — AB (ref 0.3–1.2)
BUN: 16 mg/dL (ref 6–20)
CALCIUM: 9.6 mg/dL (ref 8.9–10.3)
CO2: 27 mmol/L (ref 22–32)
Chloride: 102 mmol/L (ref 101–111)
Creatinine, Ser: 0.82 mg/dL (ref 0.61–1.24)
GFR calc non Af Amer: >60 mL/min (ref >60)
GLUCOSE: 259 mg/dL — AB (ref 65–99)
POTASSIUM: 4.5 mmol/L (ref 3.5–5.1)
Sodium: 138 mmol/L (ref 135–145)
TOTAL PROTEIN: 7.8 g/dL (ref 6.5–8.1)

## 2016-10-09 LAB — CBC WITH DIFFERENTIAL/PLATELET
BASOS PCT: 1 %
Basophils Absolute: 0.1 10*3/uL (ref 0–0.1)
EOS ABS: 0.2 10*3/uL (ref 0–0.7)
Eosinophils Relative: 2 %
HEMATOCRIT: 44 % (ref 40.0–52.0)
HEMOGLOBIN: 14.9 g/dL (ref 13.0–18.0)
LYMPHS ABS: 1.5 10*3/uL (ref 1.0–3.6)
Lymphocytes Relative: 13 %
MCH: 30.1 pg (ref 26.0–34.0)
MCHC: 34 g/dL (ref 32.0–36.0)
MCV: 88.6 fL (ref 80.0–100.0)
MONO ABS: 1.2 10*3/uL — AB (ref 0.2–1.0)
MONOS PCT: 10 %
Neutro Abs: 8.6 10*3/uL — ABNORMAL HIGH (ref 1.4–6.5)
Neutrophils Relative %: 74 %
Platelets: 232 10*3/uL (ref 150–440)
RBC: 4.96 MIL/uL (ref 4.40–5.90)
RDW: 13 % (ref 11.5–14.5)
WBC: 11.5 10*3/uL — ABNORMAL HIGH (ref 3.8–10.6)

## 2016-10-09 LAB — GLUCOSE, CAPILLARY: Glucose-Capillary: 216 mg/dL — ABNORMAL HIGH (ref 65–99)

## 2016-10-09 MED ORDER — SODIUM CHLORIDE 0.9 % IV BOLUS (SEPSIS)
1000.0000 mL | Freq: Once | INTRAVENOUS | Status: AC
  Administered 2016-10-09: 1000 mL via INTRAVENOUS

## 2016-10-09 MED ORDER — DOXYCYCLINE HYCLATE 50 MG PO CAPS: 100.0000 mg | ORAL_CAPSULE | Freq: Two times a day (BID) | ORAL | 0 refills | Status: AC

## 2016-10-09 MED ORDER — CLINDAMYCIN HCL 300 MG PO CAPS: 300.0000 mg | ORAL_CAPSULE | Freq: Three times a day (TID) | ORAL | 0 refills | Status: AC

## 2016-10-09 MED ORDER — CLINDAMYCIN PHOSPHATE 600 MG/50ML IV SOLN
600.0000 mg | Freq: Once | INTRAVENOUS | Status: AC
  Administered 2016-10-09: 600 mg via INTRAVENOUS
  Filled 2016-10-09 (×2): qty 50

## 2016-10-09 MED ORDER — DOXYCYCLINE HYCLATE 100 MG PO TABS
100.0000 mg | ORAL_TABLET | Freq: Once | ORAL | Status: AC
  Administered 2016-10-09: 100 mg via ORAL
  Filled 2016-10-09: qty 1

## 2016-10-09 NOTE — ED Provider Notes (Signed)
[facility] Emergency Department Provider Note    First MD Initiated Contact with Patient 10/09/16 2047     (approximate)  I have reviewed the triage vital signs and the nursing notes.   HISTORY  Chief Complaint Wound Infection    HPI [name] is a 26 y.o. male history of type 1 diabetes presents with several days of worsening erythema and pain to his right leg. Patient got several bug bites while mowing the lawn earlier in the week and has a bad habit of picking scabs. States that he noted increasing redness that was spreading closer up his leg today.No measured fevers. No nausea or vomiting. Is not travel outside of the area.   Past Medical History:  Diagnosis Date  . Diabetes mellitus without complication (HCC)    Type 1  . DKA, type 1 (HCC)    Family History  Problem Relation Age of Onset  . Diabetes Mother   . Hypertension Neg Hx   . Heart disease Neg Hx    History reviewed. No pertinent surgical history. Patient Active Problem List   Diagnosis Date Noted  . Urinary retention 10/06/2014  . Hypokalemia 10/06/2014  . DKA, type 1 (HCC) 10/04/2014  . DKA (diabetic ketoacidoses) (HCC) 10/04/2014  . ARF (acute renal failure) (HCC) 10/04/2014  . Leukocytosis 10/04/2014      Prior to Admission medications   Medication Sig Start Date End Date Taking? Authorizing Provider  clindamycin (CLEOCIN) 300 MG capsule Take 1 capsule (300 mg total) by mouth 3 (three) times daily. 10/09/16 10/19/16  Willy Eddy, MD  doxycycline (VIBRAMYCIN) 50 MG capsule Take 2 capsules (100 mg total) by mouth 2 (two) times daily. 10/09/16 10/16/16  Willy Eddy, MD  famotidine (PEPCID) 20 MG tablet Take 1 tablet (20 mg total) by mouth 2 (two) times daily. 01/10/16   Katha Hamming, MD  insulin detemir (LEVEMIR) 100 UNIT/ML injection Inject 0.18 mLs (18 Units total) into the skin daily. Adjust as needed. 01/10/16   Katha Hamming, MD  insulin  lispro (HUMALOG) 100 UNIT/ML injection Inject 10-30 Units into the skin 3 (three) times daily with meals. Take 10 units if blood sugar is less than 200. For 200 - 250 add 2 units. For each additional 50, add 2 more units.    [provider]  metoCLOPramide (REGLAN) 5 MG tablet Take 1 tablet (5 mg total) by mouth 4 (four) times daily. 01/10/16   Katha Hamming, MD  omeprazole (PRILOSEC OTC) 20 MG tablet Take 20 mg by mouth daily.    [provider]    Allergies Benadryl [diphenhydramine]    Social History Social History  Substance Use Topics  . Smoking status: Current Every Day Smoker  . Smokeless tobacco: Never Used  . Alcohol use No    Review of Systems Patient denies headaches, rhinorrhea, blurry vision, numbness, shortness of breath, chest pain, edema, cough, abdominal pain, nausea, vomiting, diarrhea, dysuria, fevers, rashes or hallucinations unless otherwise stated above in HPI. ____________________________________________   PHYSICAL EXAM:  VITAL SIGNS: Vitals:   10/09/16 2240 10/09/16 2302  BP: (!) 140/102 (!) 146/113  Pulse: 94 99  Resp: 16 18  Temp:      Constitutional: Alert and oriented. Well appearing and in no acute distress. Eyes: Conjunctivae are normal.  Head: Atraumatic. Nose: No congestion/rhinnorhea. Mouth/Throat: Mucous membranes are moist.   Neck: No stridor. Painless ROM.  Cardiovascular: Normal rate, regular rhythm. Grossly normal heart sounds.  Good peripheral circulation. Respiratory: Normal respiratory  effort.  No retractions. Lungs CTAB. Gastrointestinal: Soft and nontender. No distention. No abdominal bruits. No CVA tenderness. Musculoskeletal: No lower extremity tenderness nor edema.  No joint effusions. Neurologic:  Normal speech and language. No gross focal neurologic deficits are appreciated. No facial droop Skin:  Warmth and erythema to right lower extremity with streaking erythema,  There is a small area of purulent  scab on the lateral aspect without underlying fluctuance of mass Psychiatric: Mood and affect are normal. Speech and behavior are normal.  ____________________________________________   LABS (all labs ordered are listed, but only abnormal results are displayed)  Results for orders placed or performed during the hospital encounter of 10/09/16 (from the past 24 hour(s))  CBC with Differential     Status: Abnormal   Collection Time: 10/09/16  8:00 PM  Result Value Ref Range   WBC 11.5 (H) 3.8 - 10.6 K/uL   RBC 4.96 4.40 - 5.90 MIL/uL   Hemoglobin 14.9 13.0 - 18.0 g/dL   HCT 91.444.0 78.240.0 - 95.652.0 %   MCV 88.6 80.0 - 100.0 fL   MCH 30.1 26.0 - 34.0 pg   MCHC 34.0 32.0 - 36.0 g/dL   RDW 21.313.0 08.611.5 - 57.814.5 %   Platelets 232 150 - 440 K/uL   Neutrophils Relative % 74 %   Neutro Abs 8.6 (H) 1.4 - 6.5 K/uL   Lymphocytes Relative 13 %   Lymphs Abs 1.5 1.0 - 3.6 K/uL   Monocytes Relative 10 %   Monocytes Absolute 1.2 (H) 0.2 - 1.0 K/uL   Eosinophils Relative 2 %   Eosinophils Absolute 0.2 0 - 0.7 K/uL   Basophils Relative 1 %   Basophils Absolute 0.1 0 - 0.1 K/uL  Comprehensive metabolic panel     Status: Abnormal   Collection Time: 10/09/16  8:00 PM  Result Value Ref Range   Sodium 138 135 - 145 mmol/L   Potassium 4.5 3.5 - 5.1 mmol/L   Chloride 102 101 - 111 mmol/L   CO2 27 22 - 32 mmol/L   Glucose, Bld 259 (H) 65 - 99 mg/dL   BUN 16 6 - 20 mg/dL   Creatinine, Ser 4.690.82 0.61 - 1.24 mg/dL   Calcium 9.6 8.9 - 62.910.3 mg/dL   Total Protein 7.8 6.5 - 8.1 g/dL   Albumin 3.8 3.5 - 5.0 g/dL   AST 15 15 - 41 U/L   ALT 12 (L) 17 - 63 U/L   Alkaline Phosphatase 144 (H) 38 - 126 U/L   Total Bilirubin 1.4 (H) 0.3 - 1.2 mg/dL   GFR calc non Af Amer >60 >60 mL/min   GFR calc Af Amer >60 >60 mL/min   Anion gap 9 5 - 15  Glucose, capillary     Status: Abnormal   Collection Time: 10/09/16 10:06 PM  Result Value Ref Range   Glucose-Capillary 216 (H) 65 - 99 mg/dL    ____________________________________________  ____________________________________________  RADIOLOGY   ____________________________________________   PROCEDURES  Procedure(s) performed:  Procedures    Critical Care performed: no ____________________________________________   INITIAL IMPRESSION / ASSESSMENT AND PLAN / ED COURSE  Pertinent labs & imaging results that were available during my care of the patient were reviewed by me and considered in my medical decision making (see chart for details).  DDX: cellulitis, abscess, rmsf, urticaria  Welford RocheLeonard E Gleghorn is a 26 y.o. who presents to the ED with Cellulitis and warmth to the right lower extremity as described above. Patient is neurology tachycardic but  well appearing. Does have a history of type 1 diabetes but does not have evidence of DKA. IV fluids will be given for tachycardia. We'll give dose of IV antibiotics. No evidence of abscess. We'll continue to observe patient in the ER.  Clinical Course as of Oct 09 2313  Wed Oct 09, 2016  2205 Patietn well appearing.  No rapid worseing or change of the erythema and cellulitis to left leg.    [PR]    Clinical Course User Index [PR] Willy Eddy, MD   Tachycardia resolved. Patient tolerating oral hydration. This point to feel patient is appropriate for outpatient management is he has follow-up on Friday with his PCP. Discussed signs and symptoms for which the patient should return immediately to the hospital.   ____________________________________________   FINAL CLINICAL IMPRESSION(S) / ED DIAGNOSES  Final diagnoses:  Cellulitis of right lower extremity  Hyperglycemia      NEW MEDICATIONS STARTED DURING THIS VISIT:  Discharge Medication List as of 10/09/2016 10:59 PM    START taking these medications   Details  clindamycin (CLEOCIN) 300 MG capsule Take 1 capsule (300 mg total) by mouth 3 (three) times daily., Starting Wed 10/09/2016, Until Sat  10/19/2016, Print    doxycycline (VIBRAMYCIN) 50 MG capsule Take 2 capsules (100 mg total) by mouth 2 (two) times daily., Starting Wed 10/09/2016, Until Wed 10/16/2016, Print         Note:  This document was prepared using Dragon voice recognition software and may include unintentional dictation errors.    Willy Eddy, MD 10/09/16 [phone]

## 2016-10-09 NOTE — ED Notes (Signed)
MD notified of pt's BP, no new orders received.

## 2016-10-09 NOTE — ED Notes (Signed)
Pt tolerated saltines and soda well.

## 2016-10-09 NOTE — ED Triage Notes (Signed)
Pt states he was bitten by unknown bugs 1-2 weeks ago, possibly when he was mowing the lawn.  Pt states that he has a habit of picking scabs.  Several scabs noted on  R leg, one with yellow/tan exudate.  Pt has redness noted around bites and streaking from mid-calf going up towards the knee.  Area marked by mom PTA.  Pt states that it was itching a few days ago, but states that it no longer itches, but feels like a bruise.  R leg is also notably hotter than L leg.  Pt is a type I diabetic as well.  Pt is A&Ox4, in NAD.

## 2016-10-09 NOTE — ED Notes (Signed)
Pt provided saltine crackers with diet ginger ale.

## 2016-12-26 ENCOUNTER — Ambulatory Visit
Admission: RE | Admit: 2016-12-26 | Discharge: 2016-12-26 | Disposition: A | Discharge status: Home / Self Care | Payer: BLUE CROSS/BLUE SHIELD | Source: Ambulatory Visit | Attending: Family Medicine | Admitting: Family Medicine

## 2016-12-26 ENCOUNTER — Other Ambulatory Visit: Payer: Self-pay | Admitting: Family Medicine

## 2016-12-26 DIAGNOSIS — M8700 Idiopathic aseptic necrosis of unspecified bone: Secondary | ICD-10-CM

## 2016-12-26 DIAGNOSIS — E119 Type 2 diabetes mellitus without complications: Secondary | ICD-10-CM | POA: Diagnosis present

## 2016-12-26 DIAGNOSIS — M25551 Pain in right hip: Secondary | ICD-10-CM

## 2017-05-27 ENCOUNTER — Encounter: Payer: Self-pay | Admitting: Emergency Medicine

## 2017-05-27 ENCOUNTER — Emergency Department: Payer: BLUE CROSS/BLUE SHIELD

## 2017-05-27 ENCOUNTER — Inpatient Hospital Stay
Admission: EM | Admit: 2017-05-27 | Discharge: 2017-05-29 | DRG: 638 | Disposition: A | Discharge status: Home / Self Care | Payer: Self-pay | Attending: Internal Medicine | Admitting: Internal Medicine

## 2017-05-27 ENCOUNTER — Other Ambulatory Visit: Payer: Self-pay

## 2017-05-27 DIAGNOSIS — E860 Dehydration: Secondary | ICD-10-CM | POA: Diagnosis present

## 2017-05-27 DIAGNOSIS — Z888 Allergy status to other drugs, medicaments and biological substances status: Secondary | ICD-10-CM

## 2017-05-27 DIAGNOSIS — Z79899 Other long term (current) drug therapy: Secondary | ICD-10-CM

## 2017-05-27 DIAGNOSIS — F17200 Nicotine dependence, unspecified, uncomplicated: Secondary | ICD-10-CM | POA: Diagnosis present

## 2017-05-27 DIAGNOSIS — E10649 Type 1 diabetes mellitus with hypoglycemia without coma: Secondary | ICD-10-CM | POA: Diagnosis not present

## 2017-05-27 DIAGNOSIS — E1010 Type 1 diabetes mellitus with ketoacidosis without coma: Principal | ICD-10-CM | POA: Diagnosis present

## 2017-05-27 DIAGNOSIS — E1110 Type 2 diabetes mellitus with ketoacidosis without coma: Secondary | ICD-10-CM | POA: Diagnosis present

## 2017-05-27 DIAGNOSIS — N179 Acute kidney failure, unspecified: Secondary | ICD-10-CM | POA: Diagnosis present

## 2017-05-27 DIAGNOSIS — E861 Hypovolemia: Secondary | ICD-10-CM | POA: Diagnosis present

## 2017-05-27 DIAGNOSIS — Z91120 Patient's intentional underdosing of medication regimen due to financial hardship: Secondary | ICD-10-CM

## 2017-05-27 DIAGNOSIS — Z833 Family history of diabetes mellitus: Secondary | ICD-10-CM

## 2017-05-27 DIAGNOSIS — E876 Hypokalemia: Secondary | ICD-10-CM | POA: Diagnosis not present

## 2017-05-27 DIAGNOSIS — J069 Acute upper respiratory infection, unspecified: Secondary | ICD-10-CM | POA: Diagnosis present

## 2017-05-27 DIAGNOSIS — Z794 Long term (current) use of insulin: Secondary | ICD-10-CM

## 2017-05-27 DIAGNOSIS — T383X6A Underdosing of insulin and oral hypoglycemic [antidiabetic] drugs, initial encounter: Secondary | ICD-10-CM | POA: Diagnosis present

## 2017-05-27 LAB — BASIC METABOLIC PANEL
Anion gap: 16 — ABNORMAL HIGH (ref 5–15)
Anion gap: 10 (ref 5–15)
BUN: 25 mg/dL — ABNORMAL HIGH (ref 6–20)
BUN: 23 mg/dL — AB (ref 6–20)
BUN: 23 mg/dL — AB (ref 6–20)
CHLORIDE: 111 mmol/L (ref 101–111)
CHLORIDE: 111 mmol/L (ref 101–111)
CHLORIDE: 111 mmol/L (ref 101–111)
CO2: <7 mmol/L — ABNORMAL LOW (ref 22–32)
CO2: 11 mmol/L — ABNORMAL LOW (ref 22–32)
CO2: 16 mmol/L — ABNORMAL LOW (ref 22–32)
CREATININE: 1.12 mg/dL (ref 0.61–1.24)
Calcium: 7.9 mg/dL — ABNORMAL LOW (ref 8.9–10.3)
Calcium: 7.8 mg/dL — ABNORMAL LOW (ref 8.9–10.3)
Calcium: 7.9 mg/dL — ABNORMAL LOW (ref 8.9–10.3)
Creatinine, Ser: 1.56 mg/dL — ABNORMAL HIGH (ref 0.61–1.24)
Creatinine, Ser: 1.25 mg/dL — ABNORMAL HIGH (ref 0.61–1.24)
GFR calc Af Amer: >60 mL/min (ref >60)
GFR calc Af Amer: >60 mL/min (ref >60)
GFR, EST NON AFRICAN AMERICAN: 60 mL/min — AB (ref >60)
Glucose, Bld: 339 mg/dL — ABNORMAL HIGH (ref 65–99)
Glucose, Bld: 228 mg/dL — ABNORMAL HIGH (ref 65–99)
Glucose, Bld: 155 mg/dL — ABNORMAL HIGH (ref 65–99)
POTASSIUM: 4.2 mmol/L (ref 3.5–5.1)
POTASSIUM: 3.6 mmol/L (ref 3.5–5.1)
POTASSIUM: 3.2 mmol/L — AB (ref 3.5–5.1)
SODIUM: 141 mmol/L (ref 135–145)
SODIUM: 138 mmol/L (ref 135–145)
SODIUM: 137 mmol/L (ref 135–145)

## 2017-05-27 LAB — CBC
HEMATOCRIT: 46.8 % (ref 40.0–52.0)
HEMOGLOBIN: 14.9 g/dL (ref 13.0–18.0)
MCH: 29.4 pg (ref 26.0–34.0)
MCHC: 31.8 g/dL — AB (ref 32.0–36.0)
MCV: 92.4 fL (ref 80.0–100.0)
Platelets: 263 10*3/uL (ref 150–440)
RBC: 5.06 MIL/uL (ref 4.40–5.90)
RDW: 13.8 % (ref 11.5–14.5)
WBC: 22.1 10*3/uL — ABNORMAL HIGH (ref 3.8–10.6)

## 2017-05-27 LAB — URINALYSIS, COMPLETE (UACMP) WITH MICROSCOPIC
Bacteria, UA: NONE SEEN
Bilirubin Urine: NEGATIVE
Ketones, ur: 80 mg/dL — AB
Leukocytes, UA: NEGATIVE
NITRITE: NEGATIVE
PH: 5 (ref 5.0–8.0)
Protein, ur: 30 mg/dL — AB
Specific Gravity, Urine: 1.023 (ref 1.005–1.030)

## 2017-05-27 LAB — GLUCOSE, CAPILLARY
GLUCOSE-CAPILLARY: 128 mg/dL — AB (ref 65–99)
GLUCOSE-CAPILLARY: 133 mg/dL — AB (ref 65–99)
GLUCOSE-CAPILLARY: 150 mg/dL — AB (ref 65–99)
GLUCOSE-CAPILLARY: 178 mg/dL — AB (ref 65–99)
GLUCOSE-CAPILLARY: 218 mg/dL — AB (ref 65–99)
GLUCOSE-CAPILLARY: 246 mg/dL — AB (ref 65–99)
GLUCOSE-CAPILLARY: 346 mg/dL — AB (ref 65–99)
Glucose-Capillary: >600 mg/dL (ref 65–99)
Glucose-Capillary: 537 mg/dL (ref 65–99)
Glucose-Capillary: 211 mg/dL — ABNORMAL HIGH (ref 65–99)
Glucose-Capillary: 214 mg/dL — ABNORMAL HIGH (ref 65–99)
Glucose-Capillary: 154 mg/dL — ABNORMAL HIGH (ref 65–99)
Glucose-Capillary: 130 mg/dL — ABNORMAL HIGH (ref 65–99)

## 2017-05-27 LAB — BLOOD GAS, VENOUS
PCO2 VEN: 19 mmHg — AB (ref 44.0–60.0)
Patient temperature: 37
pH, Ven: 7.03 — CL (ref 7.250–7.430)

## 2017-05-27 LAB — INFLUENZA PANEL BY PCR (TYPE A & B)
INFLAPCR: NEGATIVE
Influenza B By PCR: NEGATIVE

## 2017-05-27 LAB — COMPREHENSIVE METABOLIC PANEL
ALK PHOS: 99 U/L (ref 38–126)
ALT: 17 U/L (ref 17–63)
AST: 29 U/L (ref 15–41)
Albumin: 4.2 g/dL (ref 3.5–5.0)
BILIRUBIN TOTAL: 3.4 mg/dL — AB (ref 0.3–1.2)
BUN: 30 mg/dL — ABNORMAL HIGH (ref 6–20)
CALCIUM: 8.7 mg/dL — AB (ref 8.9–10.3)
CO2: <7 mmol/L — ABNORMAL LOW (ref 22–32)
CREATININE: 2.29 mg/dL — AB (ref 0.61–1.24)
Chloride: 88 mmol/L — ABNORMAL LOW (ref 101–111)
GFR calc non Af Amer: 38 mL/min — ABNORMAL LOW (ref >60)
GFR, EST AFRICAN AMERICAN: 44 mL/min — AB (ref >60)
Glucose, Bld: 871 mg/dL (ref 65–99)
Potassium: 4.6 mmol/L (ref 3.5–5.1)
SODIUM: 133 mmol/L — AB (ref 135–145)
TOTAL PROTEIN: 7 g/dL (ref 6.5–8.1)

## 2017-05-27 LAB — MRSA PCR SCREENING: MRSA BY PCR: NEGATIVE

## 2017-05-27 LAB — TROPONIN I

## 2017-05-27 MED ORDER — POTASSIUM CHLORIDE 10 MEQ/100ML IV SOLN
10.0000 meq | INTRAVENOUS | Status: AC
  Administered 2017-05-27 – 2017-05-28 (×4): 10 meq via INTRAVENOUS
  Filled 2017-05-27 (×4): qty 100

## 2017-05-27 MED ORDER — ACETAMINOPHEN 650 MG RE SUPP: 650.0000 mg | Freq: Four times a day (QID) | RECTAL | Status: DC | PRN

## 2017-05-27 MED ORDER — SODIUM CHLORIDE 0.9 % IV BOLUS (SEPSIS)
1500.0000 mL | Freq: Once | INTRAVENOUS | Status: AC
  Administered 2017-05-27: 1500 mL via INTRAVENOUS

## 2017-05-27 MED ORDER — SODIUM CHLORIDE 0.9 % IV SOLN
INTRAVENOUS | Status: DC
  Administered 2017-05-27: 3.4 [IU]/h via INTRAVENOUS
  Filled 2017-05-27: qty 1

## 2017-05-27 MED ORDER — SODIUM CHLORIDE 0.9 % IV SOLN
INTRAVENOUS | Status: DC
  Administered 2017-05-27 – 2017-05-28 (×2): via INTRAVENOUS

## 2017-05-27 MED ORDER — PROMETHAZINE HCL 25 MG/ML IJ SOLN
6.2500 mg | Freq: Four times a day (QID) | INTRAMUSCULAR | Status: DC | PRN
  Administered 2017-05-27 – 2017-05-28 (×3): 6.25 mg via INTRAVENOUS
  Filled 2017-05-27 (×3): qty 1

## 2017-05-27 MED ORDER — ORAL CARE MOUTH RINSE
15.0000 mL | Freq: Two times a day (BID) | OROMUCOSAL | Status: DC
  Administered 2017-05-28 – 2017-05-29 (×2): 15 mL via OROMUCOSAL
  Filled 2017-05-27 (×2): qty 15

## 2017-05-27 MED ORDER — SODIUM CHLORIDE 0.9 % IV SOLN
1000.0000 mL | Freq: Once | INTRAVENOUS | Status: AC
  Administered 2017-05-27: 1000 mL via INTRAVENOUS

## 2017-05-27 MED ORDER — INSULIN REGULAR HUMAN 100 UNIT/ML IJ SOLN
INTRAMUSCULAR | Status: DC
  Administered 2017-05-27: 5.4 [IU]/h via INTRAVENOUS
  Filled 2017-05-27: qty 1

## 2017-05-27 MED ORDER — ACETAMINOPHEN 325 MG PO TABS
650.0000 mg | ORAL_TABLET | Freq: Four times a day (QID) | ORAL | Status: DC | PRN
  Administered 2017-05-28 – 2017-05-29 (×3): 650 mg via ORAL
  Filled 2017-05-27 (×3): qty 2

## 2017-05-27 MED ORDER — POTASSIUM CHLORIDE 10 MEQ/100ML IV SOLN
10.0000 meq | INTRAVENOUS | Status: AC
  Administered 2017-05-27: 10 meq via INTRAVENOUS
  Filled 2017-05-27 (×2): qty 100

## 2017-05-27 MED ORDER — ONDANSETRON HCL 4 MG/2ML IJ SOLN
4.0000 mg | Freq: Four times a day (QID) | INTRAMUSCULAR | Status: DC | PRN
  Administered 2017-05-27 (×2): 4 mg via INTRAVENOUS
  Filled 2017-05-27 (×2): qty 2

## 2017-05-27 MED ORDER — ALBUTEROL SULFATE (2.5 MG/3ML) 0.083% IN NEBU: 2.5000 mg | INHALATION_SOLUTION | RESPIRATORY_TRACT | Status: DC | PRN

## 2017-05-27 MED ORDER — DEXTROSE-NACL 5-0.45 % IV SOLN
INTRAVENOUS | Status: DC
  Administered 2017-05-27 – 2017-05-28 (×3): via INTRAVENOUS

## 2017-05-27 MED ORDER — SODIUM CHLORIDE 0.9% FLUSH
3.0000 mL | Freq: Two times a day (BID) | INTRAVENOUS | Status: DC
  Administered 2017-05-27 – 2017-05-29 (×5): 3 mL via INTRAVENOUS

## 2017-05-27 MED ORDER — HEPARIN SODIUM (PORCINE) 5000 UNIT/ML IJ SOLN
5000.0000 [IU] | Freq: Three times a day (TID) | INTRAMUSCULAR | Status: DC
  Administered 2017-05-27 – 2017-05-29 (×6): 5000 [IU] via SUBCUTANEOUS
  Filled 2017-05-27 (×6): qty 1

## 2017-05-27 MED ORDER — CHLORHEXIDINE GLUCONATE 0.12 % MT SOLN
15.0000 mL | Freq: Two times a day (BID) | OROMUCOSAL | Status: DC
  Administered 2017-05-29: 15 mL via OROMUCOSAL
  Filled 2017-05-27: qty 15

## 2017-05-27 MED ORDER — ONDANSETRON HCL 4 MG PO TABS: 4.0000 mg | ORAL_TABLET | Freq: Four times a day (QID) | ORAL | Status: DC | PRN

## 2017-05-27 MED ORDER — POLYETHYLENE GLYCOL 3350 17 G PO PACK: 17.0000 g | PACK | Freq: Every day | ORAL | Status: DC | PRN

## 2017-05-27 NOTE — H&P (Signed)
[employer] - [place] [facility]   PATIENT NAME: [name]    MR#:  [id number]  DATE OF BIRTH:  01/24/1991  DATE OF ADMISSION:  05/27/2017  PRIMARY CARE PHYSICIAN: Sharilyn SitesHeffington, Mark, MD   REQUESTING/REFERRING PHYSICIAN: Dr. Mayford KnifeWilliams  CHIEF COMPLAINT:   Chief Complaint  Patient presents with  . Emesis    HISTORY OF PRESENT ILLNESS:  [name]  is a 27 y.o. male with a known history of insulin-dependent diabetes mellitus presents to the hospital due to nausea, not feeling well and mild cold symptoms with high blood sugars from home brought in by his mother.  Patient missed his insulin yesterday as he was not feeling well and did not eat.  Patient has been found to have blood glucose of 871 with DKA and acute kidney injury.  He had a similar episode of DKA 3 years back and has done well since then.  Takes Lantus 18 units daily. Due to patient's drowsiness and he is not contributing to history and shows us his mother to speak for himself.  Old records reviewed.  PAST MEDICAL HISTORY:   Past Medical History:  Diagnosis Date  . Diabetes mellitus without complication (HCC)    Type 1  . DKA, type 1 (HCC)     PAST SURGICAL HISTORY:  History reviewed. No pertinent surgical history.  SOCIAL HISTORY:   Social History   Tobacco Use  . Smoking status: Current Every Day Smoker  . Smokeless tobacco: Never Used  Substance Use Topics  . Alcohol use: No    FAMILY HISTORY:   Family History  Problem Relation Age of Onset  . Diabetes Mother   . Hypertension Neg Hx   . Heart disease Neg Hx     DRUG ALLERGIES:   Allergies  Allergen Reactions  . Benadryl [Diphenhydramine] Other (See Comments)    hyperactive    REVIEW OF SYSTEMS:   Review of Systems  Unable to perform ROS: Medical condition    MEDICATIONS AT HOME:   Prior to Admission medications   Medication Sig Start Date End Date Taking? Authorizing Provider  insulin detemir (LEVEMIR) 100  UNIT/ML injection Inject 0.18 mLs (18 Units total) into the skin daily. Adjust as needed. Patient taking differently: Inject 20 Units into the skin daily. Adjust as needed.  01/10/16  Yes Katha HammingKonidena, Snehalatha, MD  insulin lispro (HUMALOG) 100 UNIT/ML injection Inject 10-30 Units into the skin 3 (three) times daily with meals. Take 10 units if blood sugar is less than 200. For 200 - 250 add 2 units. For each additional 50, add 2 more units.   Yes [provider]  omeprazole (PRILOSEC OTC) 20 MG tablet Take 20 mg by mouth daily.   Yes [provider]  famotidine (PEPCID) 20 MG tablet Take 1 tablet (20 mg total) by mouth 2 (two) times daily. Patient not taking: Reported on 05/27/2017 01/10/16   Katha HammingKonidena, Snehalatha, MD  metoCLOPramide (REGLAN) 5 MG tablet Take 1 tablet (5 mg total) by mouth 4 (four) times daily. Patient not taking: Reported on 05/27/2017 01/10/16   Katha HammingKonidena, Snehalatha, MD     VITAL SIGNS:  Blood pressure (!) 100/52, pulse (!) 130, temperature (!) 97.5 F (36.4 C), temperature source Axillary, resp. rate (!) 22, SpO2 100 %.  PHYSICAL EXAMINATION:  Physical Exam  GENERAL:  27 y.o.-year-old patient lying in the bed , critically ill.  Drowsy. EYES: Pupils equal, round, reactive to light and accommodation. No scleral icterus. Extraocular muscles intact.  HEENT:  Head atraumatic, normocephalic. Oropharynx and nasopharynx clear. No oropharyngeal erythema, dry oral mucosa  NECK:  Supple, no jugular venous distention. No thyroid enlargement, no tenderness.  LUNGS: Normal breath sounds bilaterally, no wheezing, rales, rhonchi. No use of accessory muscles of respiration.  CARDIOVASCULAR: S1, S2 tachycardia. No murmurs, rubs, or gallops.  ABDOMEN: Soft, nontender, nondistended. Bowel sounds present. No organomegaly or mass.  EXTREMITIES: No pedal edema, cyanosis, or clubbing. + 2 pedal & radial pulses b/l.   NEUROLOGIC: Cranial nerves II through XII are intact. No focal Motor or  sensory deficits appreciated b/l PSYCHIATRIC: The patient is drowsy SKIN: No obvious rash, lesion, or ulcer.   LABORATORY PANEL:   CBC Recent Labs  Lab 05/27/17 0851  WBC 22.1*  HGB 14.9  HCT 46.8  PLT 263   ------------------------------------------------------------------------------------------------------------------  Chemistries  Recent Labs  Lab 05/27/17 0851  NA 133*  K 4.6  CL 88*  CO2 <7*  GLUCOSE 871*  BUN 30*  CREATININE 2.29*  CALCIUM 8.7*  AST 29  ALT 17  ALKPHOS 99  BILITOT 3.4*   ------------------------------------------------------------------------------------------------------------------  Cardiac Enzymes Recent Labs  Lab 05/27/17 0851  TROPONINI <0.03   ------------------------------------------------------------------------------------------------------------------  RADIOLOGY:  Dg Chest Portable 1 View  Result Date: 05/27/2017 CLINICAL DATA:  Weakness and elevated blood sugar EXAM: PORTABLE CHEST 1 VIEW COMPARISON:  01/08/2016 FINDINGS: The heart size and mediastinal contours are within normal limits. Both lungs are clear. The visualized skeletal structures are unremarkable. IMPRESSION: No active disease. Electronically Signed   By: Alcide Clever M.D.   On: 05/27/2017 09:11     IMPRESSION AND PLAN:   * DKA DKA protocol.  Bolus another 1500 mL normal saline stat.  Every 4 hours BMP.  Every hour Accu-Cheks.  Insulin drip.  IV fluids.  Discussed with intensivist.  Admit to stepdown unit.  Critically ill. Can start Lantus 18 units once DKA resolves.  *Acute kidney injury due to DKA and severe dehydration.  Should improve with IV fluids.  Monitor input and output.  *Leukocytosis likely due to hemoconcentration.  Will repeat CBC after fluids.  All the records are reviewed and case discussed with ED provider. Management plans discussed with the patient, family and they are in agreement.  CODE STATUS: FULL CODE  TOTAL CC TIME TAKING CARE OF  THIS PATIENT: 40 minutes.   Molinda Bailiff Sandhya Denherder M.D on 05/27/2017 at 10:35 AM  Between 7am to 6pm - Pager - [phone]  After 6pm go to [email or web address] - password [email or web address] [facility]  [facility] [place] [facility]  Office  [phone]  CC: Primary care physician; Sharilyn Sites, MD  Note: This dictation was prepared with Dragon dictation along with smaller phrase technology. Any transcriptional errors that result from this process are unintentional.

## 2017-05-27 NOTE — ED Notes (Signed)
Blood Glucose = 537.

## 2017-05-27 NOTE — ED Notes (Signed)
ICU unable to take report.

## 2017-05-27 NOTE — ED Notes (Signed)
Insulin drip verified with jane rn

## 2017-05-27 NOTE — ED Provider Notes (Signed)
[facility] Emergency Department Provider Note   ____________________________________________    I have reviewed the triage vital signs and the nursing notes.   HISTORY  Chief Complaint Emesis     HPI [name] is a 27 y.o. male with a history of type 1 diabetes who presents with nausea/vomiting.  Mother reports that the patient was doing well last night but seem to be coming down with an upper respiratory infection with a mild cough and fatigue.  He did not take his nighttime dose of Lantus.  This morning when he awoke she notes that he is "out of it "having nausea and vomiting and appears ill.  No fevers reported.  Does have a history of DKA in the past   Past Medical History:  Diagnosis Date  . Diabetes mellitus without complication (HCC)    Type 1  . DKA, type 1 [facility])     Patient Active Problem List   Diagnosis Date Noted  . Urinary retention 10/06/2014  . Hypokalemia 10/06/2014  . DKA, type 1 (HCC) 10/04/2014  . DKA (diabetic ketoacidoses) (HCC) 10/04/2014  . ARF (acute renal failure) (HCC) 10/04/2014  . Leukocytosis 10/04/2014    History reviewed. No pertinent surgical history.  Prior to Admission medications   Medication Sig Start Date End Date Taking? Authorizing Provider  famotidine (PEPCID) 20 MG tablet Take 1 tablet (20 mg total) by mouth 2 (two) times daily. 01/10/16   Katha Hamming, MD  insulin detemir (LEVEMIR) 100 UNIT/ML injection Inject 0.18 mLs (18 Units total) into the skin daily. Adjust as needed. 01/10/16   Katha Hamming, MD  insulin lispro (HUMALOG) 100 UNIT/ML injection Inject 10-30 Units into the skin 3 (three) times daily with meals. Take 10 units if blood sugar is less than 200. For 200 - 250 add 2 units. For each additional 50, add 2 more units.    [provider]  metoCLOPramide (REGLAN) 5 MG tablet Take 1 tablet (5 mg total) by mouth 4 (four) times daily. 01/10/16   Katha Hamming,  MD  omeprazole (PRILOSEC OTC) 20 MG tablet Take 20 mg by mouth daily.    [provider]     Allergies Benadryl [diphenhydramine]  Family History  Problem Relation Age of Onset  . Diabetes Mother   . Hypertension Neg Hx   . Heart disease Neg Hx     Social History Social History   Tobacco Use  . Smoking status: Current Every Day Smoker  . Smokeless tobacco: Never Used  Substance Use Topics  . Alcohol use: No  . Drug use: No    Review of Systems  Constitutional: No fever, positive fatigue Eyes: No visual changes.  ENT: No sore throat. Cardiovascular: Denies chest pain. Respiratory: Denies shortness of breath.  Mild cough Gastrointestinal: No abdominal pain, nausea and vomiting Genitourinary: Negative for dysuria. Musculoskeletal: Negative for back pain. Skin: Negative for rash. Neurological: Negative for headaches    ____________________________________________   PHYSICAL EXAM:  VITAL SIGNS: ED Triage Vitals  Enc Vitals Group     BP 05/27/17 0838 (!) 88/43     Pulse Rate 05/27/17 0838 (!) 128     Resp 05/27/17 0838 (!) 22     Temp 05/27/17 0841 (!) 97.5 F (36.4 C)     Temp Source 05/27/17 0841 Axillary     SpO2 05/27/17 0838 100 %     Weight --      Height --      Head Circumference --  Peak Flow --      Pain Score --      Pain Loc --      Pain Edu? --      Excl. in GC? --     Constitutional: Alert and oriented.  Ill-appearing Eyes: Conjunctivae are normal.   Nose: No congestion/rhinnorhea. Mouth/Throat: Mucous membranes are dry Neck:  Painless ROM Cardiovascular: Tachycardia, regular rhythm. Grossly normal heart sounds.  Good peripheral circulation. Respiratory: Normal respiratory effort.  No retractions. Lungs CTAB. Gastrointestinal: Soft and nontender. No distention.  No CVA tenderness.  Musculoskeletal: No lower extremity tenderness nor edema.  Warm and well perfused Neurologic:  Normal speech and language. No gross focal  neurologic deficits are appreciated.  Skin:  Skin is warm, dry and intact. No rash noted. Psychiatric: Mood and affect are normal. Speech and behavior are normal.  ____________________________________________   LABS (all labs ordered are listed, but only abnormal results are displayed)  Labs Reviewed  GLUCOSE, CAPILLARY - Abnormal; Notable for the following components:      Result Value   Glucose-Capillary >600 (*)    All other components within normal limits  CBC - Abnormal; Notable for the following components:   WBC 22.1 (*)    MCHC 31.8 (*)    All other components within normal limits  BLOOD GAS, VENOUS - Abnormal; Notable for the following components:   pH, Ven 7.03 (*)    pCO2, Ven 19 (*)    All other components within normal limits  URINALYSIS, COMPLETE (UACMP) WITH MICROSCOPIC  COMPREHENSIVE METABOLIC PANEL  TROPONIN I  INFLUENZA PANEL BY PCR (TYPE A & B)  CBG MONITORING, ED   ____________________________________________  EKG  None ____________________________________________  RADIOLOGY  Chest x-ray normal ____________________________________________   PROCEDURES  Procedure(s) performed: No  Procedures   Critical Care performed: yes  CRITICAL CARE Performed by: Jene Everyobert Honestee Revard   Total critical care time: 30 minutes  Critical care time was exclusive of separately billable procedures and treating other patients.  Critical care was necessary to treat or prevent imminent or life-threatening deterioration.  Critical care was time spent personally by me on the following activities: development of treatment plan with patient and/or surrogate as well as nursing, discussions with consultants, evaluation of patient's response to treatment, examination of patient, obtaining history from patient or surrogate, ordering and performing treatments and interventions, ordering and review of laboratory studies, ordering and review of radiographic studies, pulse oximetry  and re-evaluation of patient's condition.  ____________________________________________   INITIAL IMPRESSION / ASSESSMENT AND PLAN / ED COURSE  Pertinent labs & imaging results that were available during my care of the patient were reviewed by me and considered in my medical decision making (see chart for details).  Patient presents with tachycardia, hypotension with nausea and vomiting with a history of type 1 diabetes with glucose greater than 600, this is likely consistent with DKA.  2 L IV fluid bolus started immediately.  Will check VBG, labs, chest x-ray, urinalysis and reevaluate  PH 7.03, white blood cell count likely elevated because of DKA. Afebrile, cxr normal. Insulin drip ordered. Will admit to IM.       ____________________________________________   FINAL CLINICAL IMPRESSION(S) / ED DIAGNOSES  Final diagnoses:  Diabetic ketoacidosis without coma associated with type 1 diabetes mellitus (HCC)        Note:  This document was prepared using Dragon voice recognition software and may include unintentional dictation errors.    Jene EveryKinner, Hearl Heikes, MD 05/27/17 [phone]

## 2017-05-27 NOTE — ED Notes (Signed)
ICU unable to take report at this time.

## 2017-05-27 NOTE — ED Triage Notes (Signed)
Brought in by family  High blood sugar  Vomiting  weakness

## 2017-05-27 NOTE — Consult Note (Signed)
[facility] Critical Care Medicine Consultation   ASSESSMENT/PLAN   DKA. Patient with type 1 diabetes, noncompliant with insulin therapy, possible upper respiratory tract infection presently in DKA. Blood sugar 871. Patient is placed on the DKA protocol with insulin infusion, saline resuscitation and close monitoring of electrolytes. Will be admitted to the intensive care unit when bed becomes available  Acid-based derangement. Anion gap estimated 38, delta gap of 26, calculated starting bicarbonate estimated to be 33 consistent with an initial metabolic alkalosis and subsequent anion gap embolic acidosis, arterial blood gas not obtained for complete interpretation.  Acute renal failure. Most likely reflective of hypovolemia, is presently being volume resuscitated, will follow closely. May also have a component of medical renal disease. Baseline creatinine is within normal range  Leukocytosis. Most likely reactive, no clear evidence of infection at this time we'll monitor closely and follow temperature curve    INTAKE / OUTPUT:  Intake/Output Summary (Last 24 hours) at 05/27/2017 1104 Last data filed at 05/27/2017 1101 Gross per 24 hour  Intake -  Output 400 ml  Net -400 ml     Name: [name] MRN: [id number] DOB: 01/13/1991    ADMISSION DATE:  05/27/2017 CONSULTATION DATE:  05/27/2017  REFERRING MD :  Hospitalist Service  CHIEF COMPLAINT:  Nausea and vomiting   HISTORY OF PRESENT ILLNESS:  [name] is a 27 year old Caucasian male with a past medical history of type 1 diabetes who presented to the hospital with complaints of nausea, vomiting and not feeling well. Apparently had a recent sore throat and was not eating well. He also skipped his Lantus secondary to cost issues. Developed progressive nausea, abdominal discomfort and emesis. On arrival to the emergency department his blood sugar was noted to be 871, he was also acidotic with a CO2 measured at less than 7. He was  subsequently started on the DKA protocol, given insulin, saline resuscitation and is pending admission to the intensive care unit. At the present time he is awake and per family he is less confused and he is communicating.  PAST MEDICAL HISTORY :  Past Medical History:  Diagnosis Date  . Diabetes mellitus without complication (HCC)    Type 1  . DKA, type 1 (HCC)    History reviewed. No pertinent surgical history. Prior to Admission medications   Medication Sig Start Date End Date Taking? Authorizing Provider  insulin detemir (LEVEMIR) 100 UNIT/ML injection Inject 0.18 mLs (18 Units total) into the skin daily. Adjust as needed. Patient taking differently: Inject 20 Units into the skin daily. Adjust as needed.  01/10/16  Yes Katha HammingKonidena, Snehalatha, MD  insulin lispro (HUMALOG) 100 UNIT/ML injection Inject 10-30 Units into the skin 3 (three) times daily with meals. Take 10 units if blood sugar is less than 200. For 200 - 250 add 2 units. For each additional 50, add 2 more units.   Yes [provider]  omeprazole (PRILOSEC OTC) 20 MG tablet Take 20 mg by mouth daily.   Yes [provider]  famotidine (PEPCID) 20 MG tablet Take 1 tablet (20 mg total) by mouth 2 (two) times daily. Patient not taking: Reported on 05/27/2017 01/10/16   Katha HammingKonidena, Snehalatha, MD  metoCLOPramide (REGLAN) 5 MG tablet Take 1 tablet (5 mg total) by mouth 4 (four) times daily. Patient not taking: Reported on 05/27/2017 01/10/16   Katha HammingKonidena, Snehalatha, MD   Allergies  Allergen Reactions  . Benadryl [Diphenhydramine] Other (See Comments)    hyperactive    FAMILY HISTORY:  Family History  Problem Relation Age of Onset  . Diabetes Mother   . Hypertension Neg Hx   . Heart disease Neg Hx    SOCIAL HISTORY:  reports that he has been smoking.  he has never used smokeless tobacco. He reports that he does not drink alcohol or use drugs.  REVIEW OF SYSTEMS:   Review of systems unable to be obtained secondary to  present medical status  VITAL SIGNS: Temp:  [97.5 F (36.4 C)] 97.5 F (36.4 C) (02/05 0841) Pulse Rate:  [125-132] 126 (02/05 1030) Resp:  [22] 22 (02/05 0838) BP: (88-126)/(43-77) 115/77 (02/05 1030) SpO2:  [99 %-100 %] 100 % (02/05 1030) HEMODYNAMICS:  INTAKE / OUTPUT:  Intake/Output Summary (Last 24 hours) at 05/27/2017 1104 Last data filed at 05/27/2017 1101 Gross per 24 hour  Intake -  Output 400 ml  Net -400 ml    Physical Examination:   VS: BP 115/77   Pulse (!) 126   Temp (!) 97.5 F (36.4 C) (Axillary)   Resp (!) 22   SpO2 100%   General Appearance: Ill-appearing young man, breathing quickly complaining of nausea Neuro: Patient moves all extremities, is awake alert and communicating with some level of confusion HEENT: PERRLA, very poor oral dentition, trachea midline, no thyromegaly appreciated, neck veins are flat Pulmonary: normal breath sounds., diaphragmatic excursion normal. Cardiovascular tachycardia noted on exam Abdomen: Bowel sounds, soft exam, generalized discomfort to palpation Skin:   warm, no rashes, no ecchymosis  Extremities: normal, no cyanosis, clubbing, no edema   LABS: Reviewed   LABORATORY PANEL:   CBC Recent Labs  Lab 05/27/17 0851  WBC 22.1*  HGB 14.9  HCT 46.8  PLT 263    Chemistries  Recent Labs  Lab 05/27/17 0851  NA 133*  K 4.6  CL 88*  CO2 <7*  GLUCOSE 871*  BUN 30*  CREATININE 2.29*  CALCIUM 8.7*  AST 29  ALT 17  ALKPHOS 99  BILITOT 3.4*    Recent Labs  Lab 05/27/17 0839 05/27/17 0949 05/27/17 1100  GLUCAP >600* >600* >600*   No results for input(s): PHART, PCO2ART, PO2ART in the last 168 hours. Recent Labs  Lab 05/27/17 0851  AST 29  ALT 17  ALKPHOS 99  BILITOT 3.4*  ALBUMIN 4.2    Cardiac Enzymes Recent Labs  Lab 05/27/17 0851  TROPONINI <0.03    RADIOLOGY:  Dg Chest Portable 1 View  Result Date: 05/27/2017 CLINICAL DATA:  Weakness and elevated blood sugar EXAM: PORTABLE CHEST 1  VIEW COMPARISON:  01/08/2016 FINDINGS: The heart size and mediastinal contours are within normal limits. Both lungs are clear. The visualized skeletal structures are unremarkable. IMPRESSION: No active disease. Electronically Signed   By: Alcide Clever M.D.   On: 05/27/2017 09:11    Tora Kindred, DO  05/27/2017, 11:04 AM

## 2017-05-28 LAB — BASIC METABOLIC PANEL
ANION GAP: 8 (ref 5–15)
Anion gap: 12 (ref 5–15)
Anion gap: 11 (ref 5–15)
BUN: 18 mg/dL (ref 6–20)
BUN: 17 mg/dL (ref 6–20)
BUN: 10 mg/dL (ref 6–20)
CALCIUM: 7.9 mg/dL — AB (ref 8.9–10.3)
CHLORIDE: 109 mmol/L (ref 101–111)
CHLORIDE: 109 mmol/L (ref 101–111)
CO2: 14 mmol/L — ABNORMAL LOW (ref 22–32)
CO2: 15 mmol/L — AB (ref 22–32)
CO2: 21 mmol/L — AB (ref 22–32)
CREATININE: 1.01 mg/dL (ref 0.61–1.24)
Calcium: 7.7 mg/dL — ABNORMAL LOW (ref 8.9–10.3)
Calcium: 7.6 mg/dL — ABNORMAL LOW (ref 8.9–10.3)
Chloride: 111 mmol/L (ref 101–111)
Creatinine, Ser: 0.98 mg/dL (ref 0.61–1.24)
Creatinine, Ser: 0.8 mg/dL (ref 0.61–1.24)
GFR calc Af Amer: >60 mL/min (ref >60)
GFR calc Af Amer: >60 mL/min (ref >60)
GFR calc Af Amer: >60 mL/min (ref >60)
GFR calc non Af Amer: >60 mL/min (ref >60)
GFR calc non Af Amer: >60 mL/min (ref >60)
GFR calc non Af Amer: >60 mL/min (ref >60)
GLUCOSE: 168 mg/dL — AB (ref 65–99)
GLUCOSE: 204 mg/dL — AB (ref 65–99)
GLUCOSE: 77 mg/dL (ref 65–99)
POTASSIUM: 3.6 mmol/L (ref 3.5–5.1)
POTASSIUM: 3.4 mmol/L — AB (ref 3.5–5.1)
Potassium: 3 mmol/L — ABNORMAL LOW (ref 3.5–5.1)
Sodium: 135 mmol/L (ref 135–145)
Sodium: 135 mmol/L (ref 135–145)
Sodium: 140 mmol/L (ref 135–145)

## 2017-05-28 LAB — GLUCOSE, CAPILLARY
GLUCOSE-CAPILLARY: 100 mg/dL — AB (ref 65–99)
GLUCOSE-CAPILLARY: 123 mg/dL — AB (ref 65–99)
GLUCOSE-CAPILLARY: 138 mg/dL — AB (ref 65–99)
GLUCOSE-CAPILLARY: 181 mg/dL — AB (ref 65–99)
GLUCOSE-CAPILLARY: 186 mg/dL — AB (ref 65–99)
GLUCOSE-CAPILLARY: 45 mg/dL — AB (ref 65–99)
GLUCOSE-CAPILLARY: 67 mg/dL (ref 65–99)
GLUCOSE-CAPILLARY: 72 mg/dL (ref 65–99)
Glucose-Capillary: 174 mg/dL — ABNORMAL HIGH (ref 65–99)
Glucose-Capillary: 185 mg/dL — ABNORMAL HIGH (ref 65–99)
Glucose-Capillary: 139 mg/dL — ABNORMAL HIGH (ref 65–99)
Glucose-Capillary: 131 mg/dL — ABNORMAL HIGH (ref 65–99)
Glucose-Capillary: 158 mg/dL — ABNORMAL HIGH (ref 65–99)
Glucose-Capillary: 147 mg/dL — ABNORMAL HIGH (ref 65–99)
Glucose-Capillary: 144 mg/dL — ABNORMAL HIGH (ref 65–99)
Glucose-Capillary: 41 mg/dL — CL (ref 65–99)
Glucose-Capillary: 82 mg/dL (ref 65–99)

## 2017-05-28 LAB — CBC
HEMATOCRIT: 38.7 % — AB (ref 40.0–52.0)
Hemoglobin: 13.3 g/dL (ref 13.0–18.0)
MCH: 29.4 pg (ref 26.0–34.0)
MCHC: 34.4 g/dL (ref 32.0–36.0)
MCV: 85.3 fL (ref 80.0–100.0)
Platelets: 167 10*3/uL (ref 150–440)
RBC: 4.53 MIL/uL (ref 4.40–5.90)
RDW: 13 % (ref 11.5–14.5)
WBC: 9.4 10*3/uL (ref 3.8–10.6)

## 2017-05-28 LAB — HIV ANTIBODY (ROUTINE TESTING W REFLEX): HIV Screen 4th Generation wRfx: NONREACTIVE

## 2017-05-28 LAB — GROUP A STREP BY PCR: Group A Strep by PCR: NOT DETECTED

## 2017-05-28 LAB — POTASSIUM: Potassium: 3.5 mmol/L (ref 3.5–5.1)

## 2017-05-28 MED ORDER — INSULIN ASPART 100 UNIT/ML ~~LOC~~ SOLN
0.0000 [IU] | SUBCUTANEOUS | Status: DC
  Administered 2017-05-28: 2 [IU] via SUBCUTANEOUS
  Filled 2017-05-28: qty 1

## 2017-05-28 MED ORDER — POTASSIUM CHLORIDE CRYS ER 20 MEQ PO TBCR
40.0000 meq | EXTENDED_RELEASE_TABLET | Freq: Once | ORAL | Status: AC
  Administered 2017-05-28: 40 meq via ORAL
  Filled 2017-05-28: qty 2

## 2017-05-28 MED ORDER — POTASSIUM CHLORIDE 10 MEQ/100ML IV SOLN
10.0000 meq | INTRAVENOUS | Status: AC
  Administered 2017-05-28 (×2): 10 meq via INTRAVENOUS
  Filled 2017-05-28 (×2): qty 100

## 2017-05-28 MED ORDER — INSULIN ASPART 100 UNIT/ML ~~LOC~~ SOLN
0.0000 [IU] | SUBCUTANEOUS | Status: DC
  Administered 2017-05-29: 2 [IU] via SUBCUTANEOUS
  Administered 2017-05-29 (×2): 3 [IU] via SUBCUTANEOUS
  Filled 2017-05-28 (×3): qty 1

## 2017-05-28 MED ORDER — SODIUM CHLORIDE 0.9 % IV SOLN
INTRAVENOUS | Status: DC
  Administered 2017-05-28: 22:00:00 via INTRAVENOUS

## 2017-05-28 MED ORDER — NYSTATIN 100000 UNIT/ML MT SUSP
5.0000 mL | Freq: Four times a day (QID) | OROMUCOSAL | Status: DC
  Administered 2017-05-28 – 2017-05-29 (×2): 500000 [IU] via ORAL
  Filled 2017-05-28 (×4): qty 5

## 2017-05-28 MED ORDER — INSULIN DETEMIR 100 UNIT/ML ~~LOC~~ SOLN
18.0000 [IU] | SUBCUTANEOUS | Status: DC
Start: 2017-05-28 — End: 2017-05-29
  Administered 2017-05-28 – 2017-05-29 (×2): 18 [IU] via SUBCUTANEOUS
  Filled 2017-05-28 (×2): qty 0.18

## 2017-05-28 NOTE — Progress Notes (Signed)
Patient ID: [name], male   DOB: 01/01/1991, 27 y.o.   MRN: [id number]  [facility] Critical Care Medicine Progess Note    SYNOPSIS   27 year old male with DKA felt to be due to upper respiratory tract infection and noncompliance with insulin  ASSESSMENT/PLAN   DKA. Patient's anion gap has resolved. Per protocol we'll transition off of insulin infusion, give Lantus 6, will replace potassium for hypokalemia transition to an oral diet and potentially discharged from the intensive care unit later on today  INTAKE / OUTPUT:  Intake/Output Summary (Last 24 hours) at 05/28/2017 0756 Last data filed at 05/28/2017 0636 Gross per 24 hour  Intake 2360.43 ml  Output 2255 ml  Net 105.43 ml    Name: [name] MRN: [id number] DOB: 03/01/1991    ADMISSION DATE:  05/27/2017  SUBJECTIVE:   Patient is doing well this morning, awake and alert, taking sips of liquid, anion gap has resolved  VITAL SIGNS: Temp:  [97.5 F (36.4 C)-99.1 F (37.3 C)] 99.1 F (37.3 C) (02/06 0744) Pulse Rate:  [108-152] 125 (02/06 0744) Resp:  [9-22] 18 (02/06 0744) BP: (88-141)/(43-92) 111/73 (02/06 0700) SpO2:  [95 %-100 %] 98 % (02/06 0744) Weight:  [129 lb 6.6 oz (58.7 kg)-132 lb 11.5 oz (60.2 kg)] 132 lb 11.5 oz (60.2 kg) (02/06 0500)  PHYSICAL EXAMINATION: Physical Examination:   VS: BP 111/73   Pulse (!) 125   Temp 99.1 F (37.3 C) (Oral)   Resp 18   Ht 5\' 9"  (1.753 m)   Wt 132 lb 11.5 oz (60.2 kg)   SpO2 98%   BMI 19.60 kg/m   General Appearance: No distress  Neuro:without focal findings, mental status normal. HEENT: PERRLA, EOM intact. Pulmonary: normal breath sounds   CardiovascularNormal S1,S2.  No m/r/g.   Abdomen: Benign, Soft, non-tender. Skin:   warm, no rashes, no ecchymosis  Extremities: normal, no cyanosis, clubbing.    LABORATORY PANEL:   CBC Recent Labs  Lab 05/28/17 0208  WBC 9.4  HGB 13.3  HCT 38.7*  PLT 167    Chemistries  Recent Labs  Lab  05/27/17 0851  05/28/17 0355  NA 133*   < > 135  K 4.6   < > 3.4*  CL 88*   < > 109  CO2 <7*   < > 15*  GLUCOSE 871*   < > 204*  BUN 30*   < > 17  CREATININE 2.29*   < > 0.98  CALCIUM 8.7*   < > 7.6*  AST 29  --   --   ALT 17  --   --   ALKPHOS 99  --   --   BILITOT 3.4*  --   --    < > = values in this interval not displayed.    Recent Labs  Lab 05/28/17 0301 05/28/17 0335 05/28/17 0445 05/28/17 0525 05/28/17 0634 05/28/17 0732  GLUCAP 174* 186* 181* 185* 139* 131*   No results for input(s): PHART, PCO2ART, PO2ART in the last 168 hours. Recent Labs  Lab 05/27/17 0851  AST 29  ALT 17  ALKPHOS 99  BILITOT 3.4*  ALBUMIN 4.2    Cardiac Enzymes Recent Labs  Lab 05/27/17 0851  TROPONINI <0.03    RADIOLOGY:  Dg Chest Portable 1 View  Result Date: 05/27/2017 CLINICAL DATA:  Weakness and elevated blood sugar EXAM: PORTABLE CHEST 1 VIEW COMPARISON:  01/08/2016 FINDINGS: The heart size and mediastinal contours are within normal limits. Both lungs  are clear. The visualized skeletal structures are unremarkable. IMPRESSION: No active disease. Electronically Signed   By: Alcide Clever M.D.   On: 05/27/2017 09:11     Tora Kindred, DO 05/28/2017

## 2017-05-28 NOTE — Progress Notes (Signed)
[facility] [place] at [facility]   PATIENT NAME: [name]    MR#:  [id number]  DATE OF BIRTH:  09/10/1990  SUBJECTIVE:  CHIEF COMPLAINT:   Chief Complaint  Patient presents with  . Emesis     Came with upper respi symptoms and High blood sugar. Have DKA. Demetrios LollGriswold. Still has some nausea but able to have some broth and lunch.  REVIEW OF SYSTEMS:  CONSTITUTIONAL: No fever, fatigue or weakness.  EYES: No blurred or double vision.  EARS, NOSE, AND THROAT: No tinnitus or ear pain.  RESPIRATORY: No cough, shortness of breath, wheezing or hemoptysis.  CARDIOVASCULAR: No chest pain, orthopnea, edema.  GASTROINTESTINAL: positive for nausea, vomiting, diarrhea or abdominal pain.  GENITOURINARY: No dysuria, hematuria.  ENDOCRINE: No polyuria, nocturia,  HEMATOLOGY: No anemia, easy bruising or bleeding SKIN: No rash or lesion. MUSCULOSKELETAL: No joint pain or arthritis.   NEUROLOGIC: No tingling, numbness, weakness.  PSYCHIATRY: No anxiety or depression.   ROS  DRUG ALLERGIES:   Allergies  Allergen Reactions  . Benadryl [Diphenhydramine] Other (See Comments)    hyperactive    VITALS:  Blood pressure 104/72, pulse 100, temperature 99.1 F (37.3 C), temperature source Oral, resp. rate 15, height 5\' 9"  (1.753 m), weight 60.2 kg (132 lb 11.5 oz), SpO2 95 %.  PHYSICAL EXAMINATION:  GENERAL:  27 y.o.-year-old patient lying in the bed with no acute distress.  EYES: Pupils equal, round, reactive to light and accommodation. No scleral icterus. Extraocular muscles intact.  HEENT: Head atraumatic, normocephalic. Oropharynx and nasopharynx clear.  NECK:  Supple, no jugular venous distention. No thyroid enlargement, no tenderness.  LUNGS: Normal breath sounds bilaterally, no wheezing, rales,rhonchi or crepitation. No use of accessory muscles of respiration.  CARDIOVASCULAR: S1, S2 normal. No murmurs, rubs, or gallops.  ABDOMEN: Soft, nontender, nondistended. Bowel  sounds present. No organomegaly or mass.  EXTREMITIES: No pedal edema, cyanosis, or clubbing.  NEUROLOGIC: Cranial nerves II through XII are intact. Muscle strength 4-5/5 in all extremities. Sensation intact. Gait not checked.  PSYCHIATRIC: The patient is alert and oriented x 3.  SKIN: No obvious rash, lesion, or ulcer.   Physical Exam LABORATORY PANEL:   CBC Recent Labs  Lab 05/28/17 0208  WBC 9.4  HGB 13.3  HCT 38.7*  PLT 167   ------------------------------------------------------------------------------------------------------------------  Chemistries  Recent Labs  Lab 05/27/17 0851  05/28/17 0355  NA 133*   < > 135  K 4.6   < > 3.4*  CL 88*   < > 109  CO2 <7*   < > 15*  GLUCOSE 871*   < > 204*  BUN 30*   < > 17  CREATININE 2.29*   < > 0.98  CALCIUM 8.7*   < > 7.6*  AST 29  --   --   ALT 17  --   --   ALKPHOS 99  --   --   BILITOT 3.4*  --   --    < > = values in this interval not displayed.   ------------------------------------------------------------------------------------------------------------------  Cardiac Enzymes Recent Labs  Lab 05/27/17 0851  TROPONINI <0.03   ------------------------------------------------------------------------------------------------------------------  RADIOLOGY:  Dg Chest Portable 1 View  Result Date: 05/27/2017 CLINICAL DATA:  Weakness and elevated blood sugar EXAM: PORTABLE CHEST 1 VIEW COMPARISON:  01/08/2016 FINDINGS: The heart size and mediastinal contours are within normal limits. Both lungs are clear. The visualized skeletal structures are unremarkable. IMPRESSION: No active disease. Electronically Signed   By: Alcide CleverMark  Lukens  M.D.   On: 05/27/2017 09:11    ASSESSMENT AND PLAN:   Active Problems:   DKA (diabetic ketoacidoses) (HCC)  * DKA DKA protocol.  Bolus another 1500 mL normal saline stat.  Every 4 hours BMP.  Every hour Accu-Cheks.  Insulin drip.  IV fluids.   DKA resolved, bicarbonate level is still  slightly low, we will check. Patient is transitioned to  Lantus 18 units .  *Acute kidney injury due to DKA and severe dehydration.   Improved with IV fluid.  *Leukocytosis likely due to hemoconcentration.     May have a viral upper respiratory symptoms, continue monitoring.  * Active smoking   Tobacco cesation counseling is done for 4 minutes offered nicotine patch.   All the records are reviewed and case discussed with Care Management/Social Workerr. Management plans discussed with the patient, family and they are in agreement.  CODE STATUS: Full.  TOTAL TIME TAKING CARE OF THIS PATIENT: 35 minutes.    POSSIBLE D/C IN 1-2 DAYS, DEPENDING ON CLINICAL CONDITION.   Altamese Dilling M.D on 05/28/2017   Between 7am to 6pm - Pager - [phone]  After 6pm go to [email or web address] - Social research officer, government  [facility] [place] [facility]  Office  [phone]  CC: Primary care physician; Sharilyn Sites, MD  Note: This dictation was prepared with Dragon dictation along with smaller phrase technology. Any transcriptional errors that result from this process are unintentional.

## 2017-05-28 NOTE — Care Management Note (Addendum)
Case Management Note  Patient Details  Name: [name] MRN: [id number] Date of Birth: 05/01/1990  Subjective/Objective:                 patient without insurance since turning 27, no longer under his mother's insurance. He has filed for disability and turned down. Discussed the need to continue with process.  Mother is at bedside.  [facility].  Provided application and mother verbalizes multiple reasons why she can not provide proof of residence- mostly because her landlord does not give her receipts.  Patient does not participate in discussion for his care panning and his mother raises her voice through out encounter.  CM discussed the need to complete the application and if started it, cm could fax information.    Action/Plan:  [facility]ed [facility] to confirm insulins on hand: levemir,humalog and Novolog 70/30. Updated primary nurse on encounter   Expected Discharge Date:  05/30/17               Expected Discharge Plan:     In-House Referral:     Discharge planning Services     Post Acute Care Choice:    Choice offered to:     DME Arranged:    DME Agency:     HH Arranged:    HH Agency:     Status of Service:     If discussed at [employer] of [employer] Meetings, dates discussed:    Additional Comments:  Eber HongGreene, Toa Mia R, RN 05/28/2017, 3:46 PM

## 2017-05-28 NOTE — Progress Notes (Signed)
Pt blood sugar <70 at 1700 at 41, nurse gave pt peanut butter crackers and ginger ale. Blood sugar rechecked and blood sugar 45 and pt dinner tray and apple juice and blood sugar increased to 72.

## 2017-05-28 NOTE — Progress Notes (Signed)
Inpatient Diabetes Program Recommendations  AACE/ADA: New Consensus Statement on Inpatient Glycemic Control (2015)  Target Ranges:  Prepandial:   less than 140 mg/dL      Peak postprandial:   less than 180 mg/dL (1-2 hours)      Critically ill patients:  140 - 180 mg/dL   Lab Results  Component Value Date   GLUCAP 144 (H) 05/28/2017   HGBA1C 10.4 (H) 01/08/2016    Review of Glycemic ControlResults for [name], Wayne E "[name]" (MRN [id number]) as of 05/28/2017 14:14  Ref. 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 Patient was on mothers insurance until his 89 th birthday in January, 2018.  Therefore he is now almost out of insulin.  Mother admits that patient was rationing insulin to try to make it last until she got paid.  Patient is not working at this time with mother stating that he is unable to work 3rd shift due to his DM.  She has looked into medicaid for patient however he does not qualify.  She states that she plans to pay out of pocket for MD visits.  She has looked into Drug companies prescription assistance programs but is unsure that they will qualify.  ? Whether they would qualify for [facility]? Awaiting case management consult.  If they do not, he will need more affordable insulin regimen such as Novolin 70/30 which could be purchased from [employer] for 25$ a vial.    MD consider reducing Novolog correction to sensitive tid with meals and HS and add Novolog 5 units tid with meals.    Thanks,  Beryl MeagerJenny  Sayaka Hoeppner, RN, BC-ADM Inpatient Diabetes Coordinator Pager [phone] (8a-5p)

## 2017-05-28 NOTE — Progress Notes (Signed)
Pt remains on insulin drip due to CO2 level.  Pt has had 5 runs of potassium during the night. Pt is alert and oriented.  He has not had any complaints of pain, but did have nausea during the night which was relieved by phenergan.

## 2017-05-29 LAB — BASIC METABOLIC PANEL
Anion gap: 6 (ref 5–15)
BUN: 8 mg/dL (ref 6–20)
CALCIUM: 7.7 mg/dL — AB (ref 8.9–10.3)
CO2: 23 mmol/L (ref 22–32)
CREATININE: 0.7 mg/dL (ref 0.61–1.24)
Chloride: 108 mmol/L (ref 101–111)
GFR calc non Af Amer: >60 mL/min (ref >60)
Glucose, Bld: 176 mg/dL — ABNORMAL HIGH (ref 65–99)
Potassium: 3.1 mmol/L — ABNORMAL LOW (ref 3.5–5.1)
SODIUM: 137 mmol/L (ref 135–145)

## 2017-05-29 LAB — GLUCOSE, CAPILLARY
Glucose-Capillary: 42 mg/dL — CL (ref 65–99)
Glucose-Capillary: 166 mg/dL — ABNORMAL HIGH (ref 65–99)
Glucose-Capillary: 217 mg/dL — ABNORMAL HIGH (ref 65–99)
Glucose-Capillary: 203 mg/dL — ABNORMAL HIGH (ref 65–99)

## 2017-05-29 LAB — HEMOGLOBIN A1C
Hgb A1c MFr Bld: 11.3 % — ABNORMAL HIGH (ref 4.8–5.6)
MEAN PLASMA GLUCOSE: 277.61 mg/dL

## 2017-05-29 MED ORDER — INSULIN DETEMIR 100 UNIT/ML ~~LOC~~ SOLN: 12.0000 [IU] | SUBCUTANEOUS | 1 refills | Status: DC

## 2017-05-29 MED ORDER — INSULIN LISPRO 100 UNIT/ML ~~LOC~~ SOLN: 2.0000 [IU] | Freq: Three times a day (TID) | SUBCUTANEOUS | 1 refills | Status: DC

## 2017-05-29 MED ORDER — INSULIN DETEMIR 100 UNIT/ML ~~LOC~~ SOLN: 12.0000 [IU] | SUBCUTANEOUS | Status: DC

## 2017-05-29 MED ORDER — POTASSIUM CHLORIDE CRYS ER 20 MEQ PO TBCR
40.0000 meq | EXTENDED_RELEASE_TABLET | Freq: Once | ORAL | Status: AC
  Administered 2017-05-29: 40 meq via ORAL
  Filled 2017-05-29: qty 2

## 2017-05-29 MED ORDER — CEPHALEXIN 500 MG PO CAPS: 500.0000 mg | ORAL_CAPSULE | Freq: Two times a day (BID) | ORAL | 0 refills | Status: AC

## 2017-05-29 MED ORDER — PHENOL 1.4 % MT LIQD
1.0000 | OROMUCOSAL | Status: DC | PRN
  Administered 2017-05-29: 1 via OROMUCOSAL
  Filled 2017-05-29: qty 177

## 2017-05-29 NOTE — Care Management (Signed)
Patient's discharge scripts: humalog, levemir and Kelfex has been [facility]ed to [facility].  Reminded patient and his mother about completing application as patient will not have refills on the medications.  Verbalizes understanding

## 2017-05-29 NOTE — Plan of Care (Signed)
Patient and mother have met with the Diabetes coordinator.  Have recommendations for home insulin doses.  Case management has gotten the [facility] which will assist with his insulin.

## 2017-05-29 NOTE — Progress Notes (Signed)
Pt transferred to room 239.  Pt is alert and oriented with no complaints of pain.

## 2017-05-29 NOTE — Progress Notes (Signed)
Inpatient Diabetes Program Recommendations  AACE/ADA: New Consensus Statement on Inpatient Glycemic Control (2015)  Target Ranges:  Prepandial:   less than 140 mg/dL      Peak postprandial:   less than 180 mg/dL (1-2 hours)      Critically ill patients:  140 - 180 mg/dL   Lab Results  Component Value Date   GLUCAP 217 (H) 05/29/2017   HGBA1C 11.3 (H) 05/28/2017    Review of Glycemic Control  Results for [name], Wayne E "[facility]" (MRN [id number]) as of 05/29/2017 10:44  Ref. 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 Mother has paperwork for Medication Management but based on note from Case Management, may not follow through.   Novolin 70/30 from [facility]- 16 units bid pre-breakfast and pre-supper which would be 11 basal insulin bid and 4.8 units bid  Susette RacerJulie Asherah Lavoy, RN, [place], [place]HA, CDE Diabetes Coordinator Inpatient Diabetes Program  [phone] (Team Pager) [phone] [facility] Office) 05/29/2017 10:51 AM

## 2017-05-29 NOTE — Discharge Summary (Signed)
[facility] [place] at [facility]   PATIENT NAME: Dorothea OgleLeonard Mode    MR#:  [id number]  DATE OF BIRTH:  05/28/1990  DATE OF ADMISSION:  05/27/2017 ADMITTING PHYSICIAN: Milagros LollSrikar Sudini, MD  DATE OF DISCHARGE: 05/29/2017   PRIMARY CARE PHYSICIAN: Sharilyn SitesHeffington, Mark, MD    ADMISSION DIAGNOSIS:  Diabetic ketoacidosis without coma associated with type 1 diabetes mellitus (HCC) [E10.10]  DISCHARGE DIAGNOSIS:  Active Problems:   DKA (diabetic ketoacidoses) (HCC)   SECONDARY DIAGNOSIS:   Past Medical History:  Diagnosis Date  . Diabetes mellitus without complication (HCC)    Type 1  . DKA, type 1 [facility](HCC)     HOSPITAL COURSE:   *DKA DKA protocol. Bolus another 1500 mL normal saline stat. Every 4 hours BMP. Every hour Accu-Cheks. Insulin drip. IV fluids.  DKA resolved, bicarbonate level is still slightly low, we will check. Patient is transitioned to  Lantus 18 units .  *Acute kidney injury due to DKA and severe dehydration.  Improved with IV fluid.  *Leukocytosis likely due to hemoconcentration.    May have a viral upper respiratory symptoms, continue monitoring.  * Active smoking   Tobacco cesation counseling is done for 4 minutes offered nicotine patch.  * Hypoglycemia   Due to sore throat did not eat much, so advised to take 12 U levemir instead of 20 daily.  * Hypokalemia   Replace oral.  * Upper respi tract infection   Oral keflex on d/c.  DISCHARGE CONDITIONS:   Stable.  CONSULTS OBTAINED:    DRUG ALLERGIES:   Allergies  Allergen Reactions  . Benadryl [Diphenhydramine] Other (See Comments)    hyperactive    DISCHARGE MEDICATIONS:   Allergies as of 05/29/2017      Reactions   Benadryl [diphenhydramine] Other (See Comments)   hyperactive      Medication List    STOP taking these medications   famotidine 20 MG tablet Commonly known as:  PEPCID   metoCLOPramide 5 MG tablet Commonly known as:  REGLAN     TAKE  these medications   cephALEXin 500 MG capsule Commonly known as:  KEFLEX Take 1 capsule (500 mg total) by mouth 2 (two) times daily for 4 days.   insulin detemir 100 UNIT/ML injection Commonly known as:  LEVEMIR Inject 0.12 mLs (12 Units total) into the skin daily. Start taking on:  05/30/2017 What changed:    how much to take  additional instructions   insulin lispro 100 UNIT/ML injection Commonly known as:  HUMALOG Inject 0.02-0.1 mLs (2-10 Units total) into the skin 3 (three) times daily with meals. Take 2 units if blood sugar is less than 200. For 200 - 250 add 2 units. For each additional 50, add 2 more units. What changed:    how much to take  additional instructions   omeprazole 20 MG tablet Commonly known as:  PRILOSEC OTC Take 20 mg by mouth daily.        DISCHARGE INSTRUCTIONS:   Follow with PMD in 1-2 weeks.  If you experience worsening of your admission symptoms, develop shortness of breath, life threatening emergency, suicidal or homicidal thoughts you must seek medical attention immediately by calling 911 or calling your MD immediately  if symptoms less severe.  You Must read complete instructions/literature along with all the possible adverse reactions/side effects for all the Medicines you take and that have been prescribed to you. Take any new Medicines after you have completely understood and accept all the possible  adverse reactions/side effects.   Please note  You were cared for by a hospitalist during your hospital stay. If you have any questions about your discharge medications or the care you received while you were in the hospital after you are discharged, you can call the unit and asked to speak with the hospitalist on call if the hospitalist that took care of you is not available. Once you are discharged, your primary care physician will handle any further medical issues. Please note that NO REFILLS for any discharge medications will be authorized  once you are discharged, as it is imperative that you return to your primary care physician (or establish a relationship with a primary care physician if you do not have one) for your aftercare needs so that they can reassess your need for medications and monitor your lab values.    Today   CHIEF COMPLAINT:   Chief Complaint  Patient presents with  . Emesis    HISTORY OF PRESENT ILLNESS:  Natthew Marlatt  is a 27 y.o. male with a known history of insulin-dependent diabetes mellitus presents to the hospital due to nausea, not feeling well and mild cold symptoms with high blood sugars from home brought in by his mother.  Patient missed his insulin yesterday as he was not feeling well and did not eat.  Patient has been found to have blood glucose of 871 with DKA and acute kidney injury.  He had a similar episode of DKA 3 years back and has done well since then.  Takes Lantus 18 units daily. Due to patient's drowsiness and he is not contributing to history and shows [place] his mother to speak for himself.  Old records reviewed.   VITAL SIGNS:  Blood pressure 121/79, pulse (!) 102, temperature 98.3 F (36.8 C), temperature source Oral, resp. rate 16, height 5\' 9"  (1.753 m), weight 60.2 kg (132 lb 11.5 oz), SpO2 100 %.  I/O:    Intake/Output Summary (Last 24 hours) at 05/29/2017 1124 Last data filed at 05/29/2017 1013 Gross per 24 hour  Intake 2642.58 ml  Output 2000 ml  Net 642.58 ml    PHYSICAL EXAMINATION:  GENERAL:  27 y.o.-year-old patient lying in the bed with no acute distress.  EYES: Pupils equal, round, reactive to light and accommodation. No scleral icterus. Extraocular muscles intact.  HEENT: Head atraumatic, normocephalic. Oropharynx and nasopharynx clear.  NECK:  Supple, no jugular venous distention. No thyroid enlargement, no tenderness.  LUNGS: Normal breath sounds bilaterally, no wheezing, rales,rhonchi or crepitation. No use of accessory muscles of respiration.   CARDIOVASCULAR: S1, S2 normal. No murmurs, rubs, or gallops.  ABDOMEN: Soft, non-tender, non-distended. Bowel sounds present. No organomegaly or mass.  EXTREMITIES: No pedal edema, cyanosis, or clubbing.  NEUROLOGIC: Cranial nerves II through XII are intact. Muscle strength 5/5 in all extremities. Sensation intact. Gait not checked.  PSYCHIATRIC: The patient is alert and oriented x 3.  SKIN: No obvious rash, lesion, or ulcer.   DATA REVIEW:   CBC Recent Labs  Lab 05/28/17 0208  WBC 9.4  HGB 13.3  HCT 38.7*  PLT 167    Chemistries  Recent Labs  Lab 05/27/17 0851  05/29/17 0500  NA 133*   < > 137  K 4.6   < > 3.1*  CL 88*   < > 108  CO2 <7*   < > 23  GLUCOSE 871*   < > 176*  BUN 30*   < > 8  CREATININE 2.29*   < >  0.70  CALCIUM 8.7*   < > 7.7*  AST 29  --   --   ALT 17  --   --   ALKPHOS 99  --   --   BILITOT 3.4*  --   --    < > = values in this interval not displayed.    Cardiac Enzymes Recent Labs  Lab 05/27/17 0851  TROPONINI <0.03    Microbiology Results  Results for orders placed or performed during the hospital encounter of 05/27/17  MRSA PCR Screening     Status: None   Collection Time: 05/27/17  2:32 PM  Result Value Ref Range Status   MRSA by PCR NEGATIVE NEGATIVE Final    Comment:        The GeneXpert MRSA Assay (FDA approved for NASAL specimens only), is one component of a comprehensive MRSA colonization surveillance program. It is not intended to diagnose MRSA infection nor to guide or monitor treatment for MRSA infections. Performed at [facility], [address] [place], [place] [address]   Group A Strep by PCR [facility] Only)     Status: None   Collection Time: 05/28/17  6:23 PM  Result Value Ref Range Status   Group A Strep by PCR NOT DETECTED NOT DETECTED Final    Comment: Performed at [facility], [address] [place], [place] [address]    RADIOLOGY:  No results found.  EKG:   Orders placed or  performed during the hospital encounter of 01/08/16  . EKG 12-Lead  . EKG 12-Lead      Management plans discussed with the patient, family and they are in agreement.  CODE STATUS:     Code Status Orders  (From admission, onward)        Start     Ordered   05/27/17 1030  Full code  Continuous     05/27/17 1032    Code Status History    Date Active Date Inactive Code Status Order ID Comments User Context   01/08/2016 08:11 01/10/2016 15:32 Full Code [id number]  Katha Hamming, MD ED   10/04/2014 12:25 10/06/2014 15:15 Full Code [id number]  Shaune Pollack, MD Inpatient      TOTAL TIME TAKING CARE OF THIS PATIENT: 35 minutes.    Altamese Dilling M.D on 05/29/2017 at 11:24 AM  Between 7am to 6pm - Pager - [phone]  After 6pm go to [email or web address] - Social research officer, government  [facility] [place] [facility]  Office  [phone]  CC: Primary care physician; Sharilyn Sites, MD   Note: This dictation was prepared with Dragon dictation along with smaller phrase technology. Any transcriptional errors that result from this process are unintentional.

## 2017-08-05 ENCOUNTER — Ambulatory Visit: Payer: Self-pay | Admitting: Pharmacy Technician

## 2017-08-05 ENCOUNTER — Encounter (INDEPENDENT_AMBULATORY_CARE_PROVIDER_SITE_OTHER): Payer: Self-pay

## 2017-08-05 DIAGNOSIS — Z79899 Other long term (current) drug therapy: Secondary | ICD-10-CM

## 2017-08-05 NOTE — Progress Notes (Signed)
Met with patient completed financial assistance application for [place] due to recent ED visit.  Patient agreed to be responsible for gathering financial information and forwarding to appropriate department in [place].    Completed [facility] application and contract.  Patient agreed to all terms of the [facility] contract.    Patient approved to receive medication assistance at [facility] through 2019, as long as eligibility criteria continues to be met.    Provided patient with community resource material based on his particular needs.    Levemir Prescription Application completed with patient.  Forwarded to Dr. Heffington [facility] for signature.  Upon receipt of signed application from provider and 2018 tax return and pay stubs from Mom and Medicaid Denial from patient, Levemir Prescription Application will be submitted to [employer].  Referred patient to [facility].  Betty J. Kluttz Care Manager [facility]  

## 2017-10-22 ENCOUNTER — Telehealth: Payer: Self-pay | Admitting: Pharmacy Technician

## 2017-10-22 NOTE — Telephone Encounter (Signed)
Received updated proof of income.  Patient eligible to receive medication assistance at [facility] through 2019, as long as eligibility requirements continue to be met.  [facility]

## 2017-10-25 ENCOUNTER — Encounter: Payer: Self-pay | Admitting: Emergency Medicine

## 2017-10-25 ENCOUNTER — Inpatient Hospital Stay
Admission: EM | Admit: 2017-10-25 | Discharge: 2017-10-27 | DRG: 637 | Disposition: A | Discharge status: Home / Self Care | Payer: Self-pay | Attending: Internal Medicine | Admitting: Internal Medicine

## 2017-10-25 ENCOUNTER — Other Ambulatory Visit: Payer: Self-pay

## 2017-10-25 DIAGNOSIS — E860 Dehydration: Secondary | ICD-10-CM | POA: Diagnosis present

## 2017-10-25 DIAGNOSIS — K029 Dental caries, unspecified: Secondary | ICD-10-CM | POA: Diagnosis present

## 2017-10-25 DIAGNOSIS — F17210 Nicotine dependence, cigarettes, uncomplicated: Secondary | ICD-10-CM | POA: Diagnosis present

## 2017-10-25 DIAGNOSIS — Z79899 Other long term (current) drug therapy: Secondary | ICD-10-CM

## 2017-10-25 DIAGNOSIS — E876 Hypokalemia: Secondary | ICD-10-CM | POA: Diagnosis present

## 2017-10-25 DIAGNOSIS — Z681 Body mass index (BMI) 19 or less, adult: Secondary | ICD-10-CM

## 2017-10-25 DIAGNOSIS — N179 Acute kidney failure, unspecified: Secondary | ICD-10-CM | POA: Diagnosis present

## 2017-10-25 DIAGNOSIS — Z888 Allergy status to other drugs, medicaments and biological substances status: Secondary | ICD-10-CM

## 2017-10-25 DIAGNOSIS — E43 Unspecified severe protein-calorie malnutrition: Secondary | ICD-10-CM | POA: Diagnosis present

## 2017-10-25 DIAGNOSIS — D72829 Elevated white blood cell count, unspecified: Secondary | ICD-10-CM | POA: Diagnosis present

## 2017-10-25 DIAGNOSIS — E1110 Type 2 diabetes mellitus with ketoacidosis without coma: Secondary | ICD-10-CM | POA: Diagnosis present

## 2017-10-25 DIAGNOSIS — E1010 Type 1 diabetes mellitus with ketoacidosis without coma: Principal | ICD-10-CM | POA: Diagnosis present

## 2017-10-25 LAB — CBC
HCT: 40.1 % (ref 40.0–52.0)
HEMATOCRIT: 41.5 % (ref 40.0–52.0)
Hemoglobin: 14.6 g/dL (ref 13.0–18.0)
Hemoglobin: 13.7 g/dL (ref 13.0–18.0)
MCH: 30 pg (ref 26.0–34.0)
MCH: 29.6 pg (ref 26.0–34.0)
MCHC: 35.3 g/dL (ref 32.0–36.0)
MCHC: 34.1 g/dL (ref 32.0–36.0)
MCV: 84.9 fL (ref 80.0–100.0)
MCV: 86.8 fL (ref 80.0–100.0)
PLATELETS: 261 10*3/uL (ref 150–440)
PLATELETS: 230 10*3/uL (ref 150–440)
RBC: 4.89 MIL/uL (ref 4.40–5.90)
RBC: 4.62 MIL/uL (ref 4.40–5.90)
RDW: 12.8 % (ref 11.5–14.5)
RDW: 13 % (ref 11.5–14.5)
WBC: 13.5 10*3/uL — ABNORMAL HIGH (ref 3.8–10.6)
WBC: 12.4 10*3/uL — AB (ref 3.8–10.6)

## 2017-10-25 LAB — COMPREHENSIVE METABOLIC PANEL
ALT: 14 U/L (ref 0–44)
AST: 15 U/L (ref 15–41)
Albumin: 4 g/dL (ref 3.5–5.0)
Alkaline Phosphatase: 87 U/L (ref 38–126)
Anion gap: 23 — ABNORMAL HIGH (ref 5–15)
BUN: 22 mg/dL — AB (ref 6–20)
CHLORIDE: 98 mmol/L (ref 98–111)
CO2: 12 mmol/L — ABNORMAL LOW (ref 22–32)
CREATININE: 1.38 mg/dL — AB (ref 0.61–1.24)
Calcium: 8.7 mg/dL — ABNORMAL LOW (ref 8.9–10.3)
GFR calc Af Amer: >60 mL/min (ref >60)
GLUCOSE: 187 mg/dL — AB (ref 70–99)
Potassium: 3.5 mmol/L (ref 3.5–5.1)
SODIUM: 133 mmol/L — AB (ref 135–145)
Total Bilirubin: 3.2 mg/dL — ABNORMAL HIGH (ref 0.3–1.2)
Total Protein: 7.1 g/dL (ref 6.5–8.1)

## 2017-10-25 LAB — BASIC METABOLIC PANEL
Anion gap: 22 — ABNORMAL HIGH (ref 5–15)
BUN: 21 mg/dL — ABNORMAL HIGH (ref 6–20)
CALCIUM: 8 mg/dL — AB (ref 8.9–10.3)
CHLORIDE: 101 mmol/L (ref 98–111)
CO2: 11 mmol/L — ABNORMAL LOW (ref 22–32)
CREATININE: 1.31 mg/dL — AB (ref 0.61–1.24)
Glucose, Bld: 301 mg/dL — ABNORMAL HIGH (ref 70–99)
Potassium: 3.7 mmol/L (ref 3.5–5.1)
Sodium: 134 mmol/L — ABNORMAL LOW (ref 135–145)

## 2017-10-25 LAB — GLUCOSE, CAPILLARY
GLUCOSE-CAPILLARY: 276 mg/dL — AB (ref 70–99)
Glucose-Capillary: 279 mg/dL — ABNORMAL HIGH (ref 70–99)
Glucose-Capillary: 280 mg/dL — ABNORMAL HIGH (ref 70–99)
Glucose-Capillary: 191 mg/dL — ABNORMAL HIGH (ref 70–99)
Glucose-Capillary: 227 mg/dL — ABNORMAL HIGH (ref 70–99)

## 2017-10-25 LAB — BLOOD GAS, VENOUS
ACID-BASE DEFICIT: 13.9 mmol/L — AB (ref 0.0–2.0)
BICARBONATE: 11.2 mmol/L — AB (ref 20.0–28.0)
O2 SAT: 89.2 %
PATIENT TEMPERATURE: 37
PH VEN: 7.26 (ref 7.250–7.430)
pCO2, Ven: 25 mmHg — ABNORMAL LOW (ref 44.0–60.0)
pO2, Ven: 66 mmHg — ABNORMAL HIGH (ref 32.0–45.0)

## 2017-10-25 LAB — MRSA PCR SCREENING: MRSA by PCR: NEGATIVE

## 2017-10-25 LAB — BETA-HYDROXYBUTYRIC ACID
BETA-HYDROXYBUTYRIC ACID: 10.81 mmol/L — AB (ref 0.05–0.27)
Beta-Hydroxybutyric Acid: 8.77 mmol/L — ABNORMAL HIGH (ref 0.05–0.27)

## 2017-10-25 MED ORDER — ONDANSETRON HCL 4 MG/2ML IJ SOLN
4.0000 mg | Freq: Four times a day (QID) | INTRAMUSCULAR | Status: DC | PRN
  Administered 2017-10-26 (×2): 4 mg via INTRAVENOUS
  Filled 2017-10-25 (×2): qty 2

## 2017-10-25 MED ORDER — HYDROCODONE-ACETAMINOPHEN 5-325 MG PO TABS
1.0000 | ORAL_TABLET | Freq: Once | ORAL | Status: AC
  Administered 2017-10-25: 1 via ORAL
  Filled 2017-10-25: qty 1

## 2017-10-25 MED ORDER — DEXTROSE-NACL 5-0.45 % IV SOLN: INTRAVENOUS | Status: DC

## 2017-10-25 MED ORDER — SODIUM CHLORIDE 0.9 % IV BOLUS
1000.0000 mL | Freq: Once | INTRAVENOUS | Status: AC
  Administered 2017-10-26: 1000 mL via INTRAVENOUS

## 2017-10-25 MED ORDER — INSULIN REGULAR HUMAN 100 UNIT/ML IJ SOLN
INTRAMUSCULAR | Status: DC
  Administered 2017-10-25: 2.2 [IU]/h via INTRAVENOUS
  Filled 2017-10-25: qty 1

## 2017-10-25 MED ORDER — OMEPRAZOLE MAGNESIUM 20 MG PO TBEC: 20.0000 mg | DELAYED_RELEASE_TABLET | Freq: Every day | ORAL | Status: DC

## 2017-10-25 MED ORDER — SODIUM CHLORIDE 0.9 % IV SOLN
INTRAVENOUS | Status: DC
  Administered 2017-10-25: 21:00:00 via INTRAVENOUS

## 2017-10-25 MED ORDER — ONDANSETRON HCL 4 MG/2ML IJ SOLN
4.0000 mg | Freq: Once | INTRAMUSCULAR | Status: AC
  Administered 2017-10-25: 4 mg via INTRAVENOUS
  Filled 2017-10-25: qty 2

## 2017-10-25 MED ORDER — SODIUM BICARBONATE 8.4 % IV SOLN
150.0000 meq | Freq: Once | INTRAVENOUS | Status: AC
  Administered 2017-10-26: 150 meq via INTRAVENOUS
  Filled 2017-10-25: qty 150

## 2017-10-25 MED ORDER — SODIUM CHLORIDE 0.9 % IV SOLN
INTRAVENOUS | Status: AC
  Administered 2017-10-25: 21:00:00 via INTRAVENOUS

## 2017-10-25 MED ORDER — INSULIN DETEMIR 100 UNIT/ML ~~LOC~~ SOLN
10.0000 [IU] | Freq: Every day | SUBCUTANEOUS | Status: DC
  Administered 2017-10-25 – 2017-10-26 (×2): 10 [IU] via SUBCUTANEOUS
  Filled 2017-10-25 (×2): qty 0.1

## 2017-10-25 MED ORDER — SODIUM CHLORIDE 0.9 % IV SOLN
Freq: Once | INTRAVENOUS | Status: AC
  Administered 2017-10-25: 18:00:00 via INTRAVENOUS

## 2017-10-25 MED ORDER — KCL IN DEXTROSE-NACL 20-5-0.45 MEQ/L-%-% IV SOLN: Freq: Once | INTRAVENOUS | Status: DC

## 2017-10-25 MED ORDER — SODIUM CHLORIDE 0.9 % IV SOLN
INTRAVENOUS | Status: DC
  Filled 2017-10-25: qty 1

## 2017-10-25 MED ORDER — PANTOPRAZOLE SODIUM 40 MG PO TBEC
40.0000 mg | DELAYED_RELEASE_TABLET | Freq: Every day | ORAL | Status: DC
  Administered 2017-10-26 – 2017-10-27 (×2): 40 mg via ORAL
  Filled 2017-10-25 (×2): qty 1

## 2017-10-25 MED ORDER — DEXTROSE-NACL 5-0.45 % IV SOLN
INTRAVENOUS | Status: DC
  Administered 2017-10-25: via INTRAVENOUS

## 2017-10-25 MED ORDER — ENOXAPARIN SODIUM 40 MG/0.4ML ~~LOC~~ SOLN
40.0000 mg | SUBCUTANEOUS | Status: DC
  Administered 2017-10-25 – 2017-10-26 (×2): 40 mg via SUBCUTANEOUS
  Filled 2017-10-25 (×2): qty 0.4

## 2017-10-25 MED ORDER — MORPHINE SULFATE (PF) 4 MG/ML IV SOLN
4.0000 mg | Freq: Once | INTRAVENOUS | Status: AC
  Administered 2017-10-25: 4 mg via INTRAVENOUS
  Filled 2017-10-25: qty 1

## 2017-10-25 NOTE — Progress Notes (Signed)
eLink Physician-Brief Progress Note Patient Name: Wayne RocheLeonard E Grant DOB: 08/17/1990 MRN: [id number]   Date of Service  10/25/2017  HPI/Events of Note  27 yo male admitted with DKA. Now on insulin IV infusion. Being admitted to stepdown bed. PCCM asked to assume care. Initial pH = 7.26. VSS.   eICU Interventions  No new orders.     Intervention Category Evaluation Type: New Patient Evaluation  Lenell AntuSommer,Steven Eugene 10/25/2017, 8:44 PM

## 2017-10-25 NOTE — ED Triage Notes (Signed)
States is in ketoacidosis. States has been feeling bad with nausea and vomiting since 1 am today.

## 2017-10-25 NOTE — H&P (Signed)
[employer] - [place] at [facility]   PATIENT NAME: [name]    MR#:  [id number]  DATE OF BIRTH:  Feb 25, 1991  DATE OF ADMISSION:  10/25/2017  PRIMARY CARE PHYSICIAN: Sharilyn Sites, MD   REQUESTING/REFERRING PHYSICIAN: Emily Filbert, MD  CHIEF COMPLAINT:   Chief Complaint  Patient presents with  . Hyperglycemia    HISTORY OF PRESENT ILLNESS:  [name]  is a 27 y.o. male with a known history of DM type 1 is being admitted for DKA. He reports feeling bad with nausea vomiting that started around 1 AM. He denies any recent illness or other complaints at this time.  In ED, Patient's labs do indicate acidemia with an elevated anion gap of 23.  He certainly appears unwell.  His family who is at bedside states he did not have any Lantus yesterday which is likely contributing to this problem.  Even though his blood sugar has improved he still is acidemic and will require an insulin drip as well as dextrose infusion. He's being admitted to ICU/Step-down for same. PAST MEDICAL HISTORY:   Past Medical History:  Diagnosis Date  . Diabetes mellitus without complication (HCC)    Type 1  . DKA, type 1 (HCC)     PAST SURGICAL HISTORY:  History reviewed. No pertinent surgical history.  SOCIAL HISTORY:   Social History   Tobacco Use  . Smoking status: Current Some Day Smoker    Types: Cigarettes  . Smokeless tobacco: Never Used  Substance Use Topics  . Alcohol use: No    FAMILY HISTORY:   Family History  Problem Relation Age of Onset  . Diabetes Mother   . Hypertension Neg Hx   . Heart disease Neg Hx     DRUG ALLERGIES:   Allergies  Allergen Reactions  . Benadryl [Diphenhydramine] Other (See Comments)    hyperactive    REVIEW OF SYSTEMS:   Review of Systems  Constitutional: Positive for malaise/fatigue. Negative for chills, fever and weight loss.  HENT: Negative for nosebleeds and sore throat.   Eyes: Negative for blurred  vision.  Respiratory: Negative for cough, shortness of breath and wheezing.   Cardiovascular: Negative for chest pain, orthopnea, leg swelling and PND.  Gastrointestinal: Positive for abdominal pain. Negative for constipation, diarrhea, heartburn, nausea and vomiting.  Genitourinary: Negative for dysuria and urgency.  Musculoskeletal: Negative for back pain.  Skin: Negative for rash.  Neurological: Negative for dizziness, speech change, focal weakness and headaches.  Endo/Heme/Allergies: Does not bruise/bleed easily.  Psychiatric/Behavioral: Negative for depression.    MEDICATIONS AT HOME:   Prior to Admission medications   Medication Sig Start Date End Date Taking? Authorizing Provider  insulin detemir (LEVEMIR) 100 UNIT/ML injection Inject 0.12 mLs (12 Units total) into the skin daily. Patient taking differently: Inject 15-20 Units into the skin every evening.  05/30/17  Yes Altamese Dilling, MD  insulin lispro (HUMALOG) 100 UNIT/ML injection Inject 0.02-0.1 mLs (2-10 Units total) into the skin 3 (three) times daily with meals. Take 2 units if blood sugar is less than 200. For 200 - 250 add 2 units. For each additional 50, add 2 more units. 05/29/17  Yes Altamese Dilling, MD  omeprazole (PRILOSEC OTC) 20 MG tablet Take 20-40 mg by mouth daily.    Yes [provider]      VITAL SIGNS:  Blood pressure (!) 96/58, pulse (!) 110, temperature 98.5 F (36.9 C), temperature source Oral, resp. rate 16, height 6\' 1"  (1.854 m),  weight 68 kg (150 lb), SpO2 99 %.  PHYSICAL EXAMINATION:  Physical Exam  GENERAL:  27 y.o.-year-old patient lying in the bed looking critically ill.  EYES: Pupils equal, round, reactive to light and accommodation. No scleral icterus. Extraocular muscles intact.  HEENT: Head atraumatic, normocephalic. Oropharynx and nasopharynx clear.  NECK:  Supple, no jugular venous distention. No thyroid enlargement, no tenderness.  LUNGS: Normal breath sounds  bilaterally, no wheezing, rales,rhonchi or crepitation. No use of accessory muscles of respiration.  CARDIOVASCULAR: S1, S2 normal. No murmurs, rubs, or gallops.  ABDOMEN: Soft, nontender, nondistended. Bowel sounds present. No organomegaly or mass.  EXTREMITIES: No pedal edema, cyanosis, or clubbing.  NEUROLOGIC: Cranial nerves II through XII are intact. Muscle strength 5/5 in all extremities. Sensation intact. Gait not checked.  PSYCHIATRIC: The patient is alert and oriented x 3.  SKIN: No obvious rash, lesion, or ulcer.   LABORATORY PANEL:   CBC Recent Labs  Lab 10/25/17 1747  WBC 13.5*  HGB 14.6  HCT 41.5  PLT 261   ------------------------------------------------------------------------------------------------------------------  Chemistries  Recent Labs  Lab 10/25/17 1747  NA 133*  K 3.5  CL 98  CO2 12*  GLUCOSE 187*  BUN 22*  CREATININE 1.38*  CALCIUM 8.7*  AST 15  ALT 14  ALKPHOS 87  BILITOT 3.2*   ------------------------------------------------------------------------------------------------------------------  Cardiac Enzymes No results for input(s): TROPONINI in the last 168 hours. ------------------------------------------------------------------------------------------------------------------  RADIOLOGY:  No results found.  IMPRESSION AND PLAN:  [age] y m with DKA  * DKA - insulin drip - monitor in ICU/Step-down  * Hypotension/Tachycardia - likely due to DKA and dehydration - agressive hydration and monitor  * DM 1 - once DKA resolves, convert to home regimen and adjust meds as need  * ARF - likely prerenal - hydrate with IVF and monitor    All the records are reviewed and case discussed with ED provider. Management plans discussed with the patient, family at bedside and they are in agreement.  CODE STATUS: FULL CODE  TOTAL TIME (critical care) TAKING CARE OF THIS PATIENT: 45 minutes.    Delfino LovettVipul Clete Kuch M.D on 10/25/2017 at 7:54  PM  Between 7am to 6pm - Pager - [phone]  After 6pm go to [email or web address] - Scientist, research (life sciences)password EPAS [facility]  [facility]  Hospitalists  Office  [phone]  CC: Primary care physician; Sharilyn SitesHeffington, Mark, MD   Note: This dictation was prepared with Dragon dictation along with smaller phrase technology. Any transcriptional errors that result from this process are unintentional.

## 2017-10-25 NOTE — Consult Note (Signed)
PULMONARY / CRITICAL CARE MEDICINE   Name: [name] MRN: [id number] DOB: 1991-03-21    ADMISSION DATE:  10/25/2017   CONSULTATION DATE: 10/25/2017  REFERRING MD:Dr. Sherryll Grant Reason: DKA   HISTORY OF PRESENT ILLNESS:   27 year old Caucasian male with type 1 diabetes presenting with nausea and vomiting x1 day.  He states that he started feeling "crappy" earlier today, took his insulin but did not feel better hence he decided to come to the emergency room.  At the ED, he was found to be in DKA.  He is still complaining of nausea and vomiting.  He is being admitted to the ICU for insulin therapy.  PAST MEDICAL HISTORY :  He  has a past medical history of Diabetes mellitus without complication (HCC) and DKA, type 1 (HCC).  PAST SURGICAL HISTORY: He  has no past surgical history on file.  Allergies  Allergen Reactions  . Benadryl [Diphenhydramine] Other (See Comments)    hyperactive    No current facility-administered medications on file prior to encounter.    Current Outpatient Medications on File Prior to Encounter  Medication Sig  . insulin detemir (LEVEMIR) 100 UNIT/ML injection Inject 0.12 mLs (12 Units total) into the skin daily. (Patient taking differently: Inject 15-20 Units into the skin every evening. )  . insulin lispro (HUMALOG) 100 UNIT/ML injection Inject 0.02-0.1 mLs (2-10 Units total) into the skin 3 (three) times daily with meals. Take 2 units if blood sugar is less than 200. For 200 - 250 add 2 units. For each additional 50, add 2 more units.  Marland Kitchen omeprazole (PRILOSEC OTC) 20 MG tablet Take 20-40 mg by mouth daily.     FAMILY HISTORY:  His indicated that the status of his mother is unknown. He indicated that the status of his neg hx is unknown.   SOCIAL HISTORY: He  reports that he has been smoking cigarettes.  He has never used smokeless tobacco. He reports that he does not drink alcohol or use drugs.  REVIEW OF SYSTEMS:   Constitutional: Negative for fever and  chills.  HENT: Negative for congestion and rhinorrhea.  Eyes: Negative for redness and visual disturbance.  Respiratory: Negative for shortness of breath and wheezing.  Cardiovascular: Negative for chest pain and palpitations.  Gastrointestinal: Positive for nausea , vomiting and abdominal pain negative for loose stools Genitourinary: Negative for dysuria and urgency.  Endocrine: Denies polyuria, polyphagia and heat intolerance Musculoskeletal: Negative for myalgias and arthralgias.  Skin: Negative for pallor and wound.  Neurological: Negative for dizziness and headaches   SUBJECTIVE:   VITAL SIGNS: BP (!) 95/49   Pulse (!) 106   Temp 98.5 F (36.9 C) (Oral)   Resp 13   Ht 6\' 1"  (1.854 m)   Wt 119 lb 11.4 oz (54.3 kg)   SpO2 99%   BMI 15.79 kg/m   HEMODYNAMICS:    VENTILATOR SETTINGS:    INTAKE / OUTPUT: No intake/output data recorded.  PHYSICAL EXAMINATION: General: Appears acutely ill Neuro: Alert and oriented x3 no focal deficits HEENT: PERRLA, trachea midline Cardiovascular: Pickup pulse regular, S1-S2, no murmur regurg or gallop, +2 pulses bilaterally Lungs: Clear to auscultation bilaterally Abdomen: Normal bowel sounds in all 4 quadrants palpation reveals no organomegaly Musculoskeletal: Joint deformities, positive range of motion Skin: Warm and dry  LABS:  BMET Recent Labs  Lab 10/25/17 1747 10/25/17 2101  NA 133* 134*  K 3.5 3.7  CL 98 101  CO2 12* 11*  BUN 22* 21*  CREATININE  1.38* 1.31*  GLUCOSE 187* 301*    Electrolytes Recent Labs  Lab 10/25/17 1747 10/25/17 2101  CALCIUM 8.7* 8.0*    CBC Recent Labs  Lab 10/25/17 1747 10/25/17 2101  WBC 13.5* 12.4*  HGB 14.6 13.7  HCT 41.5 40.1  PLT 261 230    Coag's No results for input(s): APTT, INR in the last 168 hours.  Sepsis Markers No results for input(s): LATICACIDVEN, PROCALCITON, O2SATVEN in the last 168 hours.  ABG No results for input(s): PHART, PCO2ART, PO2ART in the  last 168 hours.  Liver Enzymes Recent Labs  Lab 10/25/17 1747  AST 15  ALT 14  ALKPHOS 87  BILITOT 3.2*  ALBUMIN 4.0    Cardiac Enzymes No results for input(s): TROPONINI, PROBNP in the last 168 hours.  Glucose Recent Labs  Lab 10/25/17 2011 10/25/17 2033 10/25/17 2130 10/25/17 2237 10/25/17 2325  GLUCAP 279* 280* 276* 227* 191*    Imaging No results found.  ASSESSMENT  DKA Uncontrolled Type 1 DM Acute renal failure  PLAN DKA protocol Diabetes education Trend creatinine Monitor and correct electrolytes Hydrocodone/acetaminophen 5/325 x1 for abdominal pain Zofran 4 mg every 4 hours as needed GI and DVT prophylaxis  FAMILY  - Updates: No family at bedside.  Patient updated on current treatment plan [name] S[facility] ANP-BC Pulmonary and Critical Care Medicine [facility] Pager [phone] or [phone]  NB: This document was prepared using Dragon voice recognition software and may include unintentional dictation errors.    10/25/2017, 11:29 PM

## 2017-10-25 NOTE — ED Provider Notes (Signed)
[facility] Emergency Department Provider Note       Time seen: ----------------------------------------- 5:54 PM on 10/25/2017 -----------------------------------------   I have reviewed the triage vital signs and the nursing notes.  HISTORY   Chief Complaint Hyperglycemia    HPI [name] is a 27 y.o. male with a history of diabetes who presents to the ED for possible DKA.  Patient does have a history of type 1 diabetes and thinks he is currently in diabetic ketoacidosis.  He reports feeling bad with nausea vomiting that started around 1 AM.  Patient states it has been months since he has been in DKA.  He denies any recent illness or other complaints at this time.  Past Medical History:  Diagnosis Date  . Diabetes mellitus without complication (HCC)    Type 1  . DKA, type 1 [facility](HCC)     Patient Active Problem List   Diagnosis Date Noted  . Urinary retention 10/06/2014  . Hypokalemia 10/06/2014  . DKA, type 1 (HCC) 10/04/2014  . DKA (diabetic ketoacidoses) (HCC) 10/04/2014  . ARF (acute renal failure) (HCC) 10/04/2014  . Leukocytosis 10/04/2014    History reviewed. No pertinent surgical history.  Allergies Benadryl [diphenhydramine]  Social History Social History   Tobacco Use  . Smoking status: Current Some Day Smoker    Types: Cigarettes  . Smokeless tobacco: Never Used  Substance Use Topics  . Alcohol use: No  . Drug use: No   Review of Systems Constitutional: Negative for fever. Cardiovascular: Negative for chest pain. Respiratory: Negative for shortness of breath. Gastrointestinal: Negative for abdominal pain, positive for nausea vomiting Musculoskeletal: Negative for back pain. Skin: Negative for rash. Neurological: Negative for headaches, positive for weakness  All systems negative/normal/unremarkable except as stated in the HPI  ____________________________________________   PHYSICAL EXAM:  VITAL SIGNS: ED  Triage Vitals  Enc Vitals Group     BP 10/25/17 1741 (!) 97/50     Pulse Rate 10/25/17 1741 (!) 122     Resp 10/25/17 1741 (!) 22     Temp 10/25/17 1741 98.5 F (36.9 C)     Temp Source 10/25/17 1741 Oral     SpO2 10/25/17 1741 99 %     Weight 10/25/17 1744 150 lb (68 kg)     Height 10/25/17 1744 6\' 1"  (1.854 m)     Head Circumference --      Peak Flow --      Pain Score 10/25/17 1742 9     Pain Loc --      Pain Edu? --      Excl. in GC? --    Constitutional: Alert and oriented.  Mild distress Eyes: Conjunctivae are normal. Normal extraocular movements. ENT   Head: Normocephalic and atraumatic.   Nose: No congestion/rhinnorhea.   Mouth/Throat: Mucous membranes are moist.   Neck: No stridor. Cardiovascular: Rapid rate, regular rhythm. No murmurs, rubs, or gallops. Respiratory: Normal respiratory effort without tachypnea nor retractions. Breath sounds are clear and equal bilaterally. No wheezes/rales/rhonchi. Gastrointestinal: Soft and nontender. Normal bowel sounds Musculoskeletal: Nontender with normal range of motion in extremities. No lower extremity tenderness nor edema. Neurologic:  Normal speech and language. No gross focal neurologic deficits are appreciated.  Skin:  Skin is warm, dry and intact. No rash noted. Psychiatric: Mood and affect are normal. Speech and behavior are normal.  ____________________________________________  ED COURSE:  As part of my medical decision making, I reviewed the following data within the electronic MEDICAL RECORD NUMBER  History obtained from family if available, nursing notes, old chart and ekg, as well as notes from prior ED visits. Patient presented for possible DKA, we will assess with labs and imaging as indicated at this time.   Procedures ____________________________________________   LABS (pertinent positives/negatives)  Labs Reviewed  CBC - Abnormal; Notable for the following components:      Result Value   WBC 13.5  (*)    All other components within normal limits  COMPREHENSIVE METABOLIC PANEL - Abnormal; Notable for the following components:   Sodium 133 (*)    CO2 12 (*)    Glucose, Bld 187 (*)    BUN 22 (*)    Creatinine, Ser 1.38 (*)    Calcium 8.7 (*)    Total Bilirubin 3.2 (*)    Anion gap 23 (*)    All other components within normal limits  BLOOD GAS, VENOUS - Abnormal; Notable for the following components:   pCO2, Ven 25 (*)    pO2, Ven 66.0 (*)    Bicarbonate 11.2 (*)    Acid-base deficit 13.9 (*)    All other components within normal limits  URINALYSIS, COMPLETE (UACMP) WITH MICROSCOPIC  BETA-HYDROXYBUTYRIC ACID  CBG MONITORING, ED   CRITICAL CARE Performed by: Ulice Dash   Total critical care time: 30 minutes  Critical care time was exclusive of separately billable procedures and treating other patients.  Critical care was necessary to treat or prevent imminent or life-threatening deterioration.  Critical care was time spent personally by me on the following activities: development of treatment plan with patient and/or surrogate as well as nursing, discussions with consultants, evaluation of patient's response to treatment, examination of patient, obtaining history from patient or surrogate, ordering and performing treatments and interventions, ordering and review of laboratory studies, ordering and review of radiographic studies, pulse oximetry and re-evaluation of patient's condition.  ____________________________________________  DIFFERENTIAL DIAGNOSIS   DKA, dehydration, electrolyte abnormality, hyperglycemia  FINAL ASSESSMENT AND PLAN  DKA   Plan: The patient had presented for hyperglycemia as well as nausea and vomiting. Patient's labs do indicate acidemia with an elevated anion gap.  He certainly appears unwell.  His family states he did not have any Lantus yesterday which is likely contributing to this problem.  Even though his blood sugar has improved  he still is acidemic and will require an insulin drip as well as dextrose infusion.   Ulice Dash, MD   Note: This note was generated in part or whole with voice recognition software. Voice recognition is usually quite accurate but there are transcription errors that can and very often do occur. I apologize for any typographical errors that were not detected and corrected.     Emily Filbert, MD 10/25/17 [phone]

## 2017-10-26 LAB — BASIC METABOLIC PANEL
ANION GAP: 8 (ref 5–15)
ANION GAP: 12 (ref 5–15)
ANION GAP: 9 (ref 5–15)
BUN: 17 mg/dL (ref 6–20)
BUN: 14 mg/dL (ref 6–20)
BUN: 19 mg/dL (ref 6–20)
CHLORIDE: 105 mmol/L (ref 98–111)
CHLORIDE: 107 mmol/L (ref 98–111)
CO2: 24 mmol/L (ref 22–32)
CO2: 22 mmol/L (ref 22–32)
CO2: 23 mmol/L (ref 22–32)
CREATININE: 0.89 mg/dL (ref 0.61–1.24)
Calcium: 7.4 mg/dL — ABNORMAL LOW (ref 8.9–10.3)
Calcium: 7.5 mg/dL — ABNORMAL LOW (ref 8.9–10.3)
Calcium: 7.4 mg/dL — ABNORMAL LOW (ref 8.9–10.3)
Chloride: 108 mmol/L (ref 98–111)
Creatinine, Ser: 1.21 mg/dL (ref 0.61–1.24)
Creatinine, Ser: 0.92 mg/dL (ref 0.61–1.24)
GFR calc Af Amer: >60 mL/min (ref >60)
GFR calc Af Amer: >60 mL/min (ref >60)
GLUCOSE: 121 mg/dL — AB (ref 70–99)
GLUCOSE: 162 mg/dL — AB (ref 70–99)
Glucose, Bld: 103 mg/dL — ABNORMAL HIGH (ref 70–99)
POTASSIUM: 2.9 mmol/L — AB (ref 3.5–5.1)
POTASSIUM: 3 mmol/L — AB (ref 3.5–5.1)
Potassium: 3.8 mmol/L (ref 3.5–5.1)
SODIUM: 138 mmol/L (ref 135–145)
Sodium: 140 mmol/L (ref 135–145)
Sodium: 140 mmol/L (ref 135–145)

## 2017-10-26 LAB — GLUCOSE, CAPILLARY
GLUCOSE-CAPILLARY: 140 mg/dL — AB (ref 70–99)
GLUCOSE-CAPILLARY: 251 mg/dL — AB (ref 70–99)
GLUCOSE-CAPILLARY: 240 mg/dL — AB (ref 70–99)
Glucose-Capillary: 115 mg/dL — ABNORMAL HIGH (ref 70–99)
Glucose-Capillary: 122 mg/dL — ABNORMAL HIGH (ref 70–99)
Glucose-Capillary: 128 mg/dL — ABNORMAL HIGH (ref 70–99)
Glucose-Capillary: 135 mg/dL — ABNORMAL HIGH (ref 70–99)
Glucose-Capillary: 157 mg/dL — ABNORMAL HIGH (ref 70–99)
Glucose-Capillary: 83 mg/dL (ref 70–99)

## 2017-10-26 LAB — URINALYSIS, COMPLETE (UACMP) WITH MICROSCOPIC
BACTERIA UA: NONE SEEN
Bilirubin Urine: NEGATIVE
KETONES UR: 80 mg/dL — AB
Leukocytes, UA: NEGATIVE
NITRITE: NEGATIVE
PROTEIN: 30 mg/dL — AB
Specific Gravity, Urine: 1.016 (ref 1.005–1.030)
pH: 6 (ref 5.0–8.0)

## 2017-10-26 LAB — MAGNESIUM: MAGNESIUM: 1.6 mg/dL — AB (ref 1.7–2.4)

## 2017-10-26 LAB — PHOSPHORUS: PHOSPHORUS: 1.2 mg/dL — AB (ref 2.5–4.6)

## 2017-10-26 MED ORDER — INSULIN ASPART 100 UNIT/ML ~~LOC~~ SOLN
0.0000 [IU] | SUBCUTANEOUS | Status: DC
  Administered 2017-10-26: 3 [IU] via SUBCUTANEOUS
  Filled 2017-10-26: qty 1

## 2017-10-26 MED ORDER — POTASSIUM CHLORIDE 20 MEQ PO PACK
60.0000 meq | PACK | Freq: Once | ORAL | Status: AC
  Administered 2017-10-26: 60 meq via ORAL
  Filled 2017-10-26: qty 3

## 2017-10-26 MED ORDER — ALUM & MAG HYDROXIDE-SIMETH 200-200-20 MG/5ML PO SUSP
15.0000 mL | ORAL | Status: DC | PRN
  Administered 2017-10-26 (×2): 15 mL via ORAL
  Filled 2017-10-26 (×3): qty 30

## 2017-10-26 MED ORDER — HYDROCODONE-ACETAMINOPHEN 5-325 MG PO TABS
1.0000 | ORAL_TABLET | Freq: Four times a day (QID) | ORAL | Status: DC | PRN
  Administered 2017-10-26 – 2017-10-27 (×5): 1 via ORAL
  Filled 2017-10-26 (×2): qty 1
  Filled 2017-10-26: qty 2
  Filled 2017-10-26 (×2): qty 1

## 2017-10-26 MED ORDER — POTASSIUM CHLORIDE CRYS ER 20 MEQ PO TBCR
60.0000 meq | EXTENDED_RELEASE_TABLET | Freq: Once | ORAL | Status: AC
  Administered 2017-10-26: 60 meq via ORAL
  Filled 2017-10-26: qty 3

## 2017-10-26 MED ORDER — INSULIN ASPART 100 UNIT/ML ~~LOC~~ SOLN
0.0000 [IU] | Freq: Three times a day (TID) | SUBCUTANEOUS | Status: DC
  Administered 2017-10-26: 1 [IU] via SUBCUTANEOUS
  Administered 2017-10-26: 5 [IU] via SUBCUTANEOUS
  Administered 2017-10-27: 13:00:00 3 [IU] via SUBCUTANEOUS
  Filled 2017-10-26 (×3): qty 1

## 2017-10-26 MED ORDER — POTASSIUM CHLORIDE IN NACL 40-0.9 MEQ/L-% IV SOLN
INTRAVENOUS | Status: DC
  Administered 2017-10-26: 125 mL/h via INTRAVENOUS
  Filled 2017-10-26 (×3): qty 1000

## 2017-10-26 MED ORDER — ACETAMINOPHEN 325 MG PO TABS
650.0000 mg | ORAL_TABLET | Freq: Four times a day (QID) | ORAL | Status: DC | PRN
  Administered 2017-10-26: 650 mg via ORAL
  Filled 2017-10-26: qty 2

## 2017-10-26 MED ORDER — POTASSIUM PHOSPHATES 15 MMOLE/5ML IV SOLN
30.0000 mmol | Freq: Once | INTRAVENOUS | Status: AC
  Administered 2017-10-26: 30 mmol via INTRAVENOUS
  Filled 2017-10-26: qty 10

## 2017-10-26 MED ORDER — MAGNESIUM SULFATE 2 GM/50ML IV SOLN
2.0000 g | Freq: Once | INTRAVENOUS | Status: AC
  Administered 2017-10-26: 2 g via INTRAVENOUS
  Filled 2017-10-26: qty 50

## 2017-10-26 MED ORDER — CLINDAMYCIN HCL 150 MG PO CAPS
300.0000 mg | ORAL_CAPSULE | Freq: Three times a day (TID) | ORAL | Status: DC
  Administered 2017-10-26 – 2017-10-27 (×3): 300 mg via ORAL
  Filled 2017-10-26 (×4): qty 2

## 2017-10-26 NOTE — Progress Notes (Addendum)
[employer] - [place] at [facility]   PATIENT NAME: [name]    MR#:  [id number]  DATE OF BIRTH:  December 24, 1990  SUBJECTIVE:  CHIEF COMPLAINT:   Chief Complaint  Patient presents with  . Hyperglycemia   The patient has no complaints.  Off insulin drip. REVIEW OF SYSTEMS:  Review of Systems  Constitutional: Negative for chills, fever and malaise/fatigue.  HENT: Negative for sore throat.   Eyes: Negative for blurred vision and double vision.  Respiratory: Negative for cough, hemoptysis, shortness of breath, wheezing and stridor.   Cardiovascular: Negative for chest pain, palpitations, orthopnea and leg swelling.  Gastrointestinal: Negative for abdominal pain, blood in stool, diarrhea, melena, nausea and vomiting.  Genitourinary: Negative for dysuria, flank pain and hematuria.  Musculoskeletal: Negative for back pain and joint pain.  Skin: Negative for rash.  Neurological: Negative for dizziness, sensory change, focal weakness, seizures, loss of consciousness, weakness and headaches.  Endo/Heme/Allergies: Negative for polydipsia.  Psychiatric/Behavioral: Negative for depression. The patient is not nervous/anxious.     DRUG ALLERGIES:   Allergies  Allergen Reactions  . Benadryl [Diphenhydramine] Other (See Comments)    hyperactive   VITALS:  Blood pressure 104/66, pulse 97, temperature 98.5 F (36.9 C), temperature source Oral, resp. rate 18, height 6\' 1"  (1.854 m), weight 119 lb 11.4 oz (54.3 kg), SpO2 97 %. PHYSICAL EXAMINATION:  Physical Exam  Constitutional: He is oriented to person, place, and time.  Severe malnutrition.  HENT:  Head: Normocephalic.  Mouth/Throat: Oropharynx is clear and moist.  Eyes: Pupils are equal, round, and reactive to light. Conjunctivae and EOM are normal. No scleral icterus.  Neck: Normal range of motion. Neck supple. No JVD present. No tracheal deviation present.  Cardiovascular: Normal rate, regular rhythm and  normal heart sounds. Exam reveals no gallop.  No murmur heard. Pulmonary/Chest: Effort normal and breath sounds normal. No respiratory distress. He has no wheezes. He has no rales.  Abdominal: Soft. Bowel sounds are normal. He exhibits no distension. There is no tenderness. There is no rebound.  Musculoskeletal: Normal range of motion. He exhibits no edema or tenderness.  Neurological: He is alert and oriented to person, place, and time. No cranial nerve deficit.  Skin: No rash noted. No erythema.  Psychiatric: He has a normal mood and affect.   LABORATORY PANEL:  Male CBC Recent Labs  Lab 10/25/17 2101  WBC 12.4*  HGB 13.7  HCT 40.1  PLT 230   ------------------------------------------------------------------------------------------------------------------ Chemistries  Recent Labs  Lab 10/25/17 1747  10/26/17 0954  NA 133*   < > 138  K 3.5   < > 3.8  CL 98   < > 108  CO2 12*   < > 22  GLUCOSE 187*   < > 103*  BUN 22*   < > 14  CREATININE 1.38*   < > 0.89  CALCIUM 8.7*   < > 7.4*  MG  --   --  1.6*  AST 15  --   --   ALT 14  --   --   ALKPHOS 87  --   --   BILITOT 3.2*  --   --    < > = values in this interval not displayed.   RADIOLOGY:  No results found. ASSESSMENT AND PLAN:   89 y m with diabetes 1 and DKA  * DKA and diabetes 1 She was treated with insulin drip DKA resolved.  On Levemir 10 units subcu and sliding scale.  Monitor blood sugar and adjust dose.  * Hypotension/Tachycardia - likely due to DKA and dehydration Improved with agressive hydration.  * ARF - likely prerenal Improved with IV fluid support.  Leukocytosis due to reaction.  Improving.  Severe malnutrition.  Dietitian consult.  Hypomagnesemia and hypophosphatemia.  Given IV supplement and follow-up levels.  Tobacco abuse.  Cessation was counseled before 3 minutes.  All the records are reviewed and case discussed with Care Management/Social Worker. Management plans discussed with  the patient, family and they are in agreement.  CODE STATUS: Full Code  TOTAL TIME TAKING CARE OF THIS PATIENT: 36 minutes.   More than 50% of the time was spent in counseling/coordination of care: YES  POSSIBLE D/C IN 2 DAYS, DEPENDING ON CLINICAL CONDITION.   Shaune PollackQing Martinez Boxx M.D on 10/26/2017 at 2:22 PM  Between 7am to 6pm - Pager - [phone]  After 6pm go to [email or web address] - Therapist, nutritionalpassword EPAS [facility]  [employer] [place] [facility]

## 2017-10-26 NOTE — Progress Notes (Signed)
Maggie, NP notified of anion gap at 12,  CO2 at 23 and Potassium 3.0. Verbal orders received for 60mEq potassium PO once, continue D51/2NS at 11425ml/hr and continue insulin gtt per rate stated by glucose stabilizer for 1 hour; at 0350 recheck FSBS and re evaluate then to possibly transition patient to SSI.

## 2017-10-26 NOTE — Progress Notes (Signed)
Pt transferred from ICU to RM 132. Reports onset of new left sided facial swelling this p.m with left upper toothache with poor dentation/caries. Dr. Imogene Burnhen notified with clindamycin started with pt education done on antibiotic use and need to see dentist asap. Mother remains at bedside. Poor appetite; tolerating ginger ale and po's at this time.

## 2017-10-26 NOTE — Progress Notes (Signed)
PULMONARY / CRITICAL CARE MEDICINE   Name: Wayne RocheLeonard E Benish MRN: [id number] DOB: 03/20/1991    ADMISSION DATE:  10/25/2017   CONSULTATION DATE: 10/25/2017  REFERRING MD:Dr. Sherryll BurgerShah Reason: DKA   HISTORY OF PRESENT ILLNESS:   27 year old Caucasian male with type 1 diabetes presenting with nausea and vomiting x1 day.  He states that he started feeling "crappy" earlier today, took his insulin but did not feel better hence he decided to come to the emergency room.  At the ED, he was found to be in DKA.  He is still complaining of nausea and vomiting.  He is being admitted to the ICU for insulin therapy.  SUBJECTIVE: No acute issues overnight.  Potassium low, currently receiving replacements.  Anion gap closed CO2 normal.  Patient transitioned off IV insulin.  VITAL SIGNS: BP 103/64   Pulse (!) 110   Temp 98.3 F (36.8 C) (Oral)   Resp 18   Ht 6\' 1"  (1.854 m)   Wt 119 lb 11.4 oz (54.3 kg)   SpO2 98%   BMI 15.79 kg/m   HEMODYNAMICS:    VENTILATOR SETTINGS:    INTAKE / OUTPUT: No intake/output data recorded.  PHYSICAL EXAMINATION: General: Appears acutely ill Neuro: Alert and oriented x3 no focal deficits HEENT: PERRLA, trachea midline Cardiovascular: Pickup pulse regular, S1-S2, no murmur regurg or gallop, +2 pulses bilaterally Lungs: Clear to auscultation bilaterally Abdomen: Normal bowel sounds in all 4 quadrants palpation reveals no organomegaly Musculoskeletal: Joint deformities, positive range of motion Skin: Warm and dry  LABS:  BMET Recent Labs  Lab 10/25/17 2101 10/26/17 0031 10/26/17 0425  NA 134* 140 140  K 3.7 3.0* 2.9*  CL 101 105 107  CO2 11* 23 24  BUN 21* 19 17  CREATININE 1.31* 1.21 0.92  GLUCOSE 301* 162* 121*    Electrolytes Recent Labs  Lab 10/25/17 2101 10/26/17 0031 10/26/17 0425  CALCIUM 8.0* 7.4* 7.5*    CBC Recent Labs  Lab 10/25/17 1747 10/25/17 2101  WBC 13.5* 12.4*  HGB 14.6 13.7  HCT 41.5 40.1  PLT 261 230     Coag's No results for input(s): APTT, INR in the last 168 hours.  Sepsis Markers No results for input(s): LATICACIDVEN, PROCALCITON, O2SATVEN in the last 168 hours.  ABG No results for input(s): PHART, PCO2ART, PO2ART in the last 168 hours.  Liver Enzymes Recent Labs  Lab 10/25/17 1747  AST 15  ALT 14  ALKPHOS 87  BILITOT 3.2*  ALBUMIN 4.0    Cardiac Enzymes No results for input(s): TROPONINI, PROBNP in the last 168 hours.  Glucose Recent Labs  Lab 10/25/17 2325 10/26/17 0035 10/26/17 0139 10/26/17 0239 10/26/17 0340 10/26/17 0419  GLUCAP 191* 157* 140* 128* 115* 122*    Imaging No results found.  ASSESSMENT  DKA Uncontrolled Type 1 DM Acute renal failure Hypokalemia  PLAN DC DKA protocol and start's blood glucose monitoring every 4 hours with sliding scale insulin coverage Diabetes education Trend creatinine Monitor and correct electrolytes Change IV fluids to normal saline with 40 mEq of KCl Oral potassium supplements given Repeat BMP at 10 AM Zofran 4 mg every 4 hours as needed GI and DVT prophylaxis  Stable for transfer out of the ICU FAMILY  - Updates: No family at bedside.  Patient updated on current treatment plan [facility] ANP-BC Pulmonary and Critical Care Medicine [facility] Pager [phone] or [phone]  NB: This document was prepared using Dragon voice recognition software and may include unintentional dictation  errors.    10/26/2017, 6:55 AM

## 2017-10-26 NOTE — Progress Notes (Signed)
A&O patient arrived to ICU bed 16 for DKA. Insulin gtt infusing at 2.2 per glucose stabilizer. Patient able to give history and complete admission profile. C/o abd pain 10/10. Patient also c/o nausea. ST on cardiac monitor. CCMD, E-link, and Maggie, NP notified of arrival onto unit.

## 2017-10-26 NOTE — Progress Notes (Signed)
FSBS 115 Maggie, NP notified, verbal order received to d/c insulin gtt and NP will enter orders to transition to SSI.

## 2017-10-26 NOTE — Progress Notes (Signed)
Inpatient Diabetes Program Recommendations  AACE/ADA: New Consensus Statement on Inpatient Glycemic Control (2015)  Target Ranges:  Prepandial:   less than 140 mg/dL      Peak postprandial:   less than 180 mg/dL (1-2 hours)      Critically ill patients:  140 - 180 mg/dL   Lab Results  Component Value Date   GLUCAP 135 (H) 10/26/2017   HGBA1C 11.3 (H) 05/28/2017   Review of Glycemic Control  Diabetes history: DM 1 Outpatient Diabetes medications: Levemir 15-20 units qhs, Humalog 2-10 units tid with meals Current orders for Inpatient glycemic control: Levemir 10 units, Novolog 0-9 units tid  Inpatient Diabetes Program Recommendations:   Last A1c 11.3% on 05/28/2017, patient was counseled at that time. Please consider obtaining another A1c level to determine glucose control over the past 2-3 months.   Noted patient transitioned from IV insulin to Levemir 10 units, Novolog 0-9 units tid. Will watch trends for now.  Glucose 187 on admission, glucose 279 at start of IV insulin gtt. Per H&P patient missed the day before basal insulin.  Last admission patient having difficulty with medication management and affordable insulin.  DM Coordinator will touch base with patient on 7/8. Will watch patient trends today.  Thanks,  Christena DeemShannon Jecenia Leamer RN, MSN, BC-ADM, [facility] Inpatient Diabetes Coordinator Team Pager [phone] (8a-5p)

## 2017-10-26 NOTE — Progress Notes (Signed)
Patients FSBS 227,  Maggie, NP notified. Verbal order obtained to d/c NS and start D51/2NS at 9375ml/hr per protocol.

## 2017-10-26 NOTE — Progress Notes (Signed)
PHARMACY - CRITICAL CARE PROGRESS NOTE  Pharmacy Consult for Electrolyte Management Indication: hypokalemia   Allergies  Allergen Reactions  . Benadryl [Diphenhydramine] Other (See Comments)    hyperactive       Lab BMP Latest Ref Rng & Units 10/26/2017 10/26/2017 10/26/2017  Glucose 70 - 99 mg/dL 161(W103(H) 960(A121(H) 540(J162(H)  BUN 6 - 20 mg/dL 14 17 19   Creatinine 0.61 - 1.24 mg/dL 8.110.89 9.140.92 7.821.21  Sodium 135 - 145 mmol/L 138 140 140  Potassium 3.5 - 5.1 mmol/L 3.8 2.9(L) 3.0(L)  Chloride 98 - 111 mmol/L 108 107 105  CO2 22 - 32 mmol/L 22 24 23   Calcium 8.9 - 10.3 mg/dL 7.4(L) 7.5(L) 7.4(L)   Assessment: Patient is a 27yo male admitted for DKA. Pharmacy consulted for electrolyte management. Patient was hypokalemic earlier today but most recent K level is within range. Patient did receive 60mEq of KCl already and is currently on IVF w/5440mEQ of KCl at 17925ml/hr. Magnesium is slightly low at 1.6.  Plan:  Will order Mag Sulfate 2g IV once. Follow up on AM labs.  Clovia CuffLisa Deonna Krummel, PharmD, BCPS 10/26/2017 2:01 PM

## 2017-10-26 NOTE — Progress Notes (Signed)
Patient c/o headache 5/10. No PRN for pain ordered currently. Maggie, NP notified, acknowledged and will enter order for PRN pain medication.

## 2017-10-27 LAB — COMPREHENSIVE METABOLIC PANEL
ALK PHOS: 67 U/L (ref 38–126)
ALT: 10 U/L (ref 0–44)
AST: 11 U/L — ABNORMAL LOW (ref 15–41)
Albumin: 2.9 g/dL — ABNORMAL LOW (ref 3.5–5.0)
Anion gap: 6 (ref 5–15)
BUN: 7 mg/dL (ref 6–20)
CALCIUM: 7.7 mg/dL — AB (ref 8.9–10.3)
CO2: 30 mmol/L (ref 22–32)
CREATININE: 0.7 mg/dL (ref 0.61–1.24)
Chloride: 101 mmol/L (ref 98–111)
Glucose, Bld: 69 mg/dL — ABNORMAL LOW (ref 70–99)
Potassium: 3.1 mmol/L — ABNORMAL LOW (ref 3.5–5.1)
Sodium: 137 mmol/L (ref 135–145)
Total Bilirubin: 1.7 mg/dL — ABNORMAL HIGH (ref 0.3–1.2)
Total Protein: 5.4 g/dL — ABNORMAL LOW (ref 6.5–8.1)

## 2017-10-27 LAB — GLUCOSE, CAPILLARY
GLUCOSE-CAPILLARY: 76 mg/dL (ref 70–99)
Glucose-Capillary: 212 mg/dL — ABNORMAL HIGH (ref 70–99)

## 2017-10-27 LAB — MAGNESIUM: MAGNESIUM: 2.4 mg/dL (ref 1.7–2.4)

## 2017-10-27 LAB — PHOSPHORUS: PHOSPHORUS: 1.9 mg/dL — AB (ref 2.5–4.6)

## 2017-10-27 MED ORDER — POTASSIUM CHLORIDE CRYS ER 20 MEQ PO TBCR
40.0000 meq | EXTENDED_RELEASE_TABLET | Freq: Once | ORAL | Status: AC
  Administered 2017-10-27: 08:00:00 40 meq via ORAL
  Filled 2017-10-27: qty 2

## 2017-10-27 MED ORDER — PREMIER PROTEIN SHAKE
11.0000 [oz_av] | Freq: Three times a day (TID) | ORAL | Status: DC
  Administered 2017-10-27: 16:00:00 11 [oz_av] via ORAL

## 2017-10-27 MED ORDER — INSULIN DETEMIR 100 UNIT/ML ~~LOC~~ SOLN: 15.0000 [IU] | Freq: Every evening | SUBCUTANEOUS | 1 refills | Status: AC

## 2017-10-27 MED ORDER — INSULIN DETEMIR 100 UNIT/ML ~~LOC~~ SOLN: 15.0000 [IU] | Freq: Every day | SUBCUTANEOUS | Status: DC

## 2017-10-27 MED ORDER — FLUCONAZOLE 50 MG PO TABS
150.0000 mg | ORAL_TABLET | Freq: Once | ORAL | Status: AC
  Administered 2017-10-27: 150 mg via ORAL
  Filled 2017-10-27: qty 1

## 2017-10-27 MED ORDER — HYDROCODONE-ACETAMINOPHEN 5-325 MG PO TABS: 1.0000 | ORAL_TABLET | Freq: Four times a day (QID) | ORAL | 0 refills | Status: AC | PRN

## 2017-10-27 MED ORDER — INSULIN LISPRO 100 UNIT/ML ~~LOC~~ SOLN: 2.0000 [IU] | Freq: Three times a day (TID) | SUBCUTANEOUS | 1 refills | Status: AC

## 2017-10-27 MED ORDER — POTASSIUM PHOSPHATES 15 MMOLE/5ML IV SOLN
30.0000 mmol | Freq: Once | INTRAVENOUS | Status: AC
  Administered 2017-10-27: 08:00:00 30 mmol via INTRAVENOUS
  Filled 2017-10-27: qty 10

## 2017-10-27 MED ORDER — INSULIN ASPART 100 UNIT/ML ~~LOC~~ SOLN: 3.0000 [IU] | Freq: Three times a day (TID) | SUBCUTANEOUS | Status: DC

## 2017-10-27 MED ORDER — CLINDAMYCIN HCL 300 MG PO CAPS: 300.0000 mg | ORAL_CAPSULE | Freq: Three times a day (TID) | ORAL | 0 refills | Status: AC

## 2017-10-27 MED ORDER — INSULIN DETEMIR 100 UNIT/ML ~~LOC~~ SOLN
10.0000 [IU] | Freq: Every day | SUBCUTANEOUS | Status: DC
  Filled 2017-10-27: qty 0.1

## 2017-10-27 MED ORDER — ADULT MULTIVITAMIN W/MINERALS CH: 1.0000 | ORAL_TABLET | Freq: Every day | ORAL | Status: DC

## 2017-10-27 NOTE — Progress Notes (Signed)
Pt is being discharged home. Discharge papers given and explained to pt, verbalized understanding. Meds and f/u appointment reviewed. RX given. 

## 2017-10-27 NOTE — Progress Notes (Signed)
Initial Nutrition Assessment  DOCUMENTATION CODES:   Severe malnutrition in context of chronic illness  INTERVENTION:  Provide Premier Protein po TID, each supplement provides 160 kcal and 30 grams of protein.  Provide daily MVI.  Encouraged adequate intake of calories and protein at meals to promote weight gain. Encouraged patient to choose consistent amount of carbohydrates at each meal to help promote better glycemic control on sliding scale insulin regimen at home. Patient struggles with advanced carbohydrate counting.  Noted patient does not currently have insurance. He would benefit from outpatient counseling with RD once he has insurance as better diabetes management will likely help with weight gain.  NUTRITION DIAGNOSIS:   Severe Malnutrition related to chronic illness(poorly controlled T1DM) as evidenced by moderate fat depletion, severe fat depletion, moderate muscle depletion, severe muscle depletion.  GOAL:   Patient will meet greater than or equal to 90% of their needs  MONITOR:   PO intake, Supplement acceptance, Labs, Weight trends, I & O's  REASON FOR ASSESSMENT:   Consult Assessment of nutrition requirement/status  ASSESSMENT:   27 year old male with PMHx of DM type 1 who was admitted with DKA, hypotension and tachycardia related to dehydration, acute renal failure.   Met with patient and his mother at bedside. Patient reports he has not been able to eat well here. He was originally having N/V, which have improved, but now he is having abdominal discomfort and is sore from his prior vomiting. He is also having tooth pain. He reports he is only taking small bites of food now. At home he typically has a fairly good appetite. He reports eating 4 meals per day. For breakfast he reports eating a pastry and occasionally egg whites (whole eggs make him nauseous). He was unable to describe intake at other meals as he reports they vary significantly. He does try to choose  a protein source at each meal. He has previously tried Glucerna and recently has been drinking [employer] illustr[employer], though he is drinking them infrequently. He reports he was diagnosed with DM at age 49. His current insulin regimen is Levemir 15-20 units QHS (reports he takes less at night if his CBGs were low the previous AM) and Humalog sliding scale 2-10 units QID with meals (also provides with his evening snack/meal). He struggles with advanced carbohydrate counting so that is why he is on a sliding scale per his report. Discussed with this regimen importance of eating consistent carbohydrates throughout the day as the sliding scale does not take into account carbohydrates eaten at meals. Patient reports he is aware of this and takes in consistent carbohydrates at each meal. He reports he occasionally does not take his insulin when he cannot get his medication.  Patient reports his UBW was 160 lbs 2-3 years ago before his first time having DKA. Since then he has lost approximately 30 lbs (18.8% body weight). He also reports having another recent episode of DKA.  Medications reviewed and include: clindamycin, Novolog 0-9 units TID, Levemir 10 units QHS, pantoprazole, potassium phosphate 30 mmol IV once today.  Labs reviewed: CBG 76-251 past 24 hrs, Potassium 3.1, Phosphorus 1.9. HgbA1c 11.3 on 05/28/2017.  Discussed with RN.  NUTRITION - FOCUSED PHYSICAL EXAM:    Most Recent Value  Orbital Region  Severe depletion  Upper Arm Region  Moderate depletion  Thoracic and Lumbar Region  Severe depletion  Buccal Region  Moderate depletion  Temple Region  Severe depletion  Clavicle Bone Region  Moderate depletion  Clavicle and Acromion Bone Region  Moderate depletion  Scapular Bone Region  Severe depletion  Dorsal Hand  Moderate depletion  Patellar Region  Severe depletion  Anterior Thigh Region  Severe depletion  Posterior Calf Region  Severe depletion  Edema (RD Assessment)  None  Hair   Reviewed  Eyes  Reviewed  Mouth  Reviewed [poor dentition]  Skin  Reviewed  Nails  Reviewed     Diet Order:   Diet Order           Diet - low sodium heart healthy        Diet Carb Modified Fluid consistency: Thin; Room service appropriate? Yes  Diet effective now          EDUCATION NEEDS:   Education needs have been addressed  Skin:  Skin Assessment: Reviewed RN Assessment  Last BM:  10/25/2017  Height:   Ht Readings from Last 1 Encounters:  10/26/17 5' 9"  (1.753 m)    Weight:   Wt Readings from Last 1 Encounters:  10/26/17 128 lb 6.4 oz (58.2 kg)    Ideal Body Weight:  72.7 kg  BMI:  Body mass index is 18.96 kg/m.  Estimated Nutritional Needs:   Kcal:  1865-2175 (MSJ x 1.2-1.4)  Protein:  85-100 grams (1.5-1.7 grams/kg)  Fluid:  1.7-2 L/day (30-35 mL/kg)  Willey Blade, MS, RD, LDN Office: [phone] Pager: [phone] After Hours/Weekend Pager: [phone]

## 2017-10-27 NOTE — Discharge Summary (Addendum)
[facility] [place] [facility]   PATIENT NAME: [name]    MR#:  [id number]  DATE OF BIRTH:  10-03-1990  DATE OF ADMISSION:  10/25/2017   ADMITTING PHYSICIAN: Delfino Lovett, MD  DATE OF DISCHARGE: 10/27/2017  PRIMARY CARE PHYSICIAN: Sharilyn Sites, MD   ADMISSION DIAGNOSIS:  Diabetic ketoacidosis without coma associated with type 1 diabetes mellitus (HCC) [E10.10] DISCHARGE DIAGNOSIS:  Active Problems:   DKA (diabetic ketoacidoses) (HCC)  SECONDARY DIAGNOSIS:   Past Medical History:  Diagnosis Date  . Diabetes mellitus without complication (HCC)    Type 1  . DKA, type 1 [facility])    HOSPITAL COURSE:   66 y m with diabetes 1 and DKA  * DKA and diabetes 1 She was treated with insulin drip DKA resolved.  On Levemir 10 units subcu and sliding scale. Monitor blood sugar and adjust dose. Resume home dose after discharge.  * Hypotension/Tachycardia - likely due to DKA and dehydration Improved with agressive hydration.  * ARF - likely prerenal Improved with IV fluid support.  Leukocytosis due to reaction.  Improving.  Severe malnutrition.  Dietitian consult. Hypokalemia. KCl po. Follow up K as outpatient. Hypomagnesemia and hypophosphatemia.  Given IV supplement and improved. Tobacco abuse.  Cessation was counseled before 3 minutes.  toothache with poor dentation/caries. Clindamycin po tid for 7 days, follow up dentist as outpatient. DISCHARGE CONDITIONS:  Stable, discharge to home today. CONSULTS OBTAINED:   DRUG ALLERGIES:   Allergies  Allergen Reactions  . Benadryl [Diphenhydramine] Other (See Comments)    hyperactive   DISCHARGE MEDICATIONS:   Allergies as of 10/27/2017      Reactions   Benadryl [diphenhydramine] Other (See Comments)   hyperactive      Medication List    TAKE these medications   clindamycin 300 MG capsule Commonly known as:  CLEOCIN Take 1 capsule (300 mg total) by mouth every 8 (eight) hours.     HYDROcodone-acetaminophen 5-325 MG tablet Commonly known as:  NORCO/VICODIN Take 1 tablet by mouth every 6 (six) hours as needed for severe pain.   insulin detemir 100 UNIT/ML injection Commonly known as:  LEVEMIR Inject 0.15-0.2 mLs (15-20 Units total) into the skin every evening.   insulin lispro 100 UNIT/ML injection Commonly known as:  HUMALOG Inject 0.02-0.1 mLs (2-10 Units total) into the skin 3 (three) times daily with meals. Take 2 units if blood sugar is less than 200. For 200 - 250 add 2 units. For each additional 50, add 2 more units.   omeprazole 20 MG tablet Commonly known as:  PRILOSEC OTC Take 20-40 mg by mouth daily.        DISCHARGE INSTRUCTIONS:  See AVS.  If you experience worsening of your admission symptoms, develop shortness of breath, life threatening emergency, suicidal or homicidal thoughts you must seek medical attention immediately by calling 911 or calling your MD immediately  if symptoms less severe.  You Must read complete instructions/literature along with all the possible adverse reactions/side effects for all the Medicines you take and that have been prescribed to you. Take any new Medicines after you have completely understood and accpet all the possible adverse reactions/side effects.   Please note  You were cared for by a hospitalist during your hospital stay. If you have any questions about your discharge medications or the care you received while you were in the hospital after you are discharged, you can call the unit and asked to speak with the hospitalist on call  if the hospitalist that took care of you is not available. Once you are discharged, your primary care physician will handle any further medical issues. Please note that NO REFILLS for any discharge medications will be authorized once you are discharged, as it is imperative that you return to your primary care physician (or establish a relationship with a primary care physician if you do  not have one) for your aftercare needs so that they can reassess your need for medications and monitor your lab values.    On the day of Discharge:  VITAL SIGNS:  Blood pressure 114/78, pulse 98, temperature 97.9 F (36.6 C), temperature source Oral, resp. rate (!) 21, height 5\' 9"  (1.753 m), weight 128 lb 6.4 oz (58.2 kg), SpO2 98 %. PHYSICAL EXAMINATION:  GENERAL:  27 y.o.-year-old patient lying in the bed with no acute distress. Malnutritiin. EYES: Pupils equal, round, reactive to light and accommodation. No scleral icterus. Extraocular muscles intact.  HEENT: Head atraumatic, normocephalic. Oropharynx and nasopharynx clear.  NECK:  Supple, no jugular venous distention. No thyroid enlargement, no tenderness.  LUNGS: Normal breath sounds bilaterally, no wheezing, rales,rhonchi or crepitation. No use of accessory muscles of respiration.  CARDIOVASCULAR: S1, S2 normal. No murmurs, rubs, or gallops.  ABDOMEN: Soft, non-tender, non-distended. Bowel sounds present. No organomegaly or mass.  EXTREMITIES: No pedal edema, cyanosis, or clubbing.  NEUROLOGIC: Cranial nerves II through XII are intact. Muscle strength 5/5 in all extremities. Sensation intact. Gait not checked.  PSYCHIATRIC: The patient is alert and oriented x 3.  SKIN: No obvious rash, lesion, or ulcer.  DATA REVIEW:   CBC Recent Labs  Lab 10/25/17 2101  WBC 12.4*  HGB 13.7  HCT 40.1  PLT 230    Chemistries  Recent Labs  Lab 10/27/17 0532  NA 137  K 3.1*  CL 101  CO2 30  GLUCOSE 69*  BUN 7  CREATININE 0.70  CALCIUM 7.7*  MG 2.4  AST 11*  ALT 10  ALKPHOS 67  BILITOT 1.7*     Microbiology Results  Results for orders placed or performed during the hospital encounter of 10/25/17  MRSA PCR Screening     Status: None   Collection Time: 10/25/17  8:40 PM  Result Value Ref Range Status   MRSA by PCR NEGATIVE NEGATIVE Final    Comment:        The GeneXpert MRSA Assay (FDA approved for NASAL specimens only),  is one component of a comprehensive MRSA colonization surveillance program. It is not intended to diagnose MRSA infection nor to guide or monitor treatment for MRSA infections. Performed at [facility], [address] [place], [place] [address]     RADIOLOGY:  No results found.   Management plans discussed with the patient, family and they are in agreement.  CODE STATUS: Full Code   TOTAL TIME TAKING CARE OF THIS PATIENT: 32 minutes.    Shaune PollackQing Vernisha Bacote M.D on 10/27/2017 at 8:13 AM  Between 7am to 6pm - Pager - [phone]  After 6pm go to [email or web address] - Scientist, research (life sciences)password EPAS [facility]  [facility] [place] [facility]  Office  [phone]  CC: Primary care physician; Sharilyn SitesHeffington, Mark, MD   Note: This dictation was prepared with Dragon dictation along with smaller phrase technology. Any transcriptional errors that result from this process are unintentional.

## 2017-10-27 NOTE — Care Management (Addendum)
RNCM consult was in place for medication assistance as patient has no insurance listed. Patient tells me he is currently open with the medication management clinic and that he is able to obtain all of his normal medications. He was recently unable to refill his levemir due to the holiday schedule. New orders for clindamycin at discharge. All scripts faxed to medication management. Mother Tammy [phone] to provide transportation home.  Buddy DutyJosh Jaculin Rasmus RN BSN RNCM [phone]

## 2017-10-27 NOTE — Progress Notes (Signed)
Inpatient Diabetes Program Recommendations  AACE/ADA: New Consensus Statement on Inpatient Glycemic Control (2015)  Target Ranges:  Prepandial:   less than 140 mg/dL      Peak postprandial:   less than 180 mg/dL (1-2 hours)      Critically ill patients:  140 - 180 mg/dL   Lab Results  Component Value Date   GLUCAP 212 (H) 10/27/2017   HGBA1C 11.3 (H) 05/28/2017    Note plans for patient to d/c home.  Talked with patient by phone.  He plans to follow-up at medication management clinic to obtain medications.  He stats that he has no further needs related to his diabetes at this time.   His last visit with PCP was 09/18/08.  A1C then was 11.1% which was down slightly from last hospitalization.  No further recommendations at this time.   Thanks,  Beryl MeagerJenny Gilmore List, RN, BC-ADM Inpatient Diabetes Coordinator Pager [phone] (8a-5p)

## 2017-10-27 NOTE — Progress Notes (Signed)
PHARMACY - CRITICAL CARE PROGRESS NOTE  Pharmacy Consult for Electrolyte Management Indication: hypokalemia   Allergies  Allergen Reactions  . Benadryl [Diphenhydramine] Other (See Comments)    hyperactive       Lab BMP Latest Ref Rng & Units 10/27/2017 10/26/2017 10/26/2017  Glucose 70 - 99 mg/dL 16(X69(L) 096(E103(H) 454(U121(H)  BUN 6 - 20 mg/dL 7 14 17   Creatinine 0.61 - 1.24 mg/dL 9.810.70 1.910.89 4.780.92  Sodium 135 - 145 mmol/L 137 138 140  Potassium 3.5 - 5.1 mmol/L 3.1(L) 3.8 2.9(L)  Chloride 98 - 111 mmol/L 101 108 107  CO2 22 - 32 mmol/L 30 22 24   Calcium 8.9 - 10.3 mg/dL 7.7(L) 7.4(L) 7.5(L)   Assessment: Patient is a 27yo male admitted for DKA. Pharmacy consulted for electrolyte management. Patient was hypokalemic earlier today but most recent K level is within range. Patient did receive 60mEq of KCl already and is currently on IVF w/1740mEQ of KCl at 1325ml/hr. Magnesium is slightly low at 1.6.  Plan:  Will order Mag Sulfate 2g IV once. Follow up on AM labs.  10/27/17 05:32 K 3.1, Ca 7.7, adjusted calcium 8.6, Mg 2.4, Phos 1.9. Prescriber has already ordered Klor-con 40 mEq po x 1 and potassium phosphate 30 mmol IV x 1 for this morning. Will recheck BMP this evening and all electrolytes tomorrow with AM labs.   Wanetta Funderburke A. Dahlia Bailiffookson, PharmD, BCPS 10/27/2017 8:12 AM

## 2017-11-11 ENCOUNTER — Ambulatory Visit: Payer: Self-pay

## 2019-04-01 ENCOUNTER — Telehealth: Payer: Self-pay | Admitting: Pharmacy Technician

## 2019-04-01 NOTE — Telephone Encounter (Signed)
Received 2020 proof of income.  Patient eligible to receive medication assistance at [facility] as long as eligibility requirements continue to be met.  [facility]

## 2019-04-28 ENCOUNTER — Telehealth: Payer: Self-pay | Admitting: Pharmacist

## 2019-04-28 NOTE — Telephone Encounter (Signed)
04/28/2019 8:11:42 AM - Levemir Vials pending  04/28/2019 I have received the signed portion of Novo Nordisk application back from provider--holding for patient to return his portion--mailed to patient Dec. 10, 2020.Forde Radon

## 2019-12-07 IMAGING — DX DG CHEST 1V PORT
1 series · 1 of 1 positions shown · non-contrast
Comparison: 01/08/2016

CLINICAL DATA: Weakness and elevated blood sugar

EXAM:
PORTABLE CHEST 1 VIEW

[chest ap]
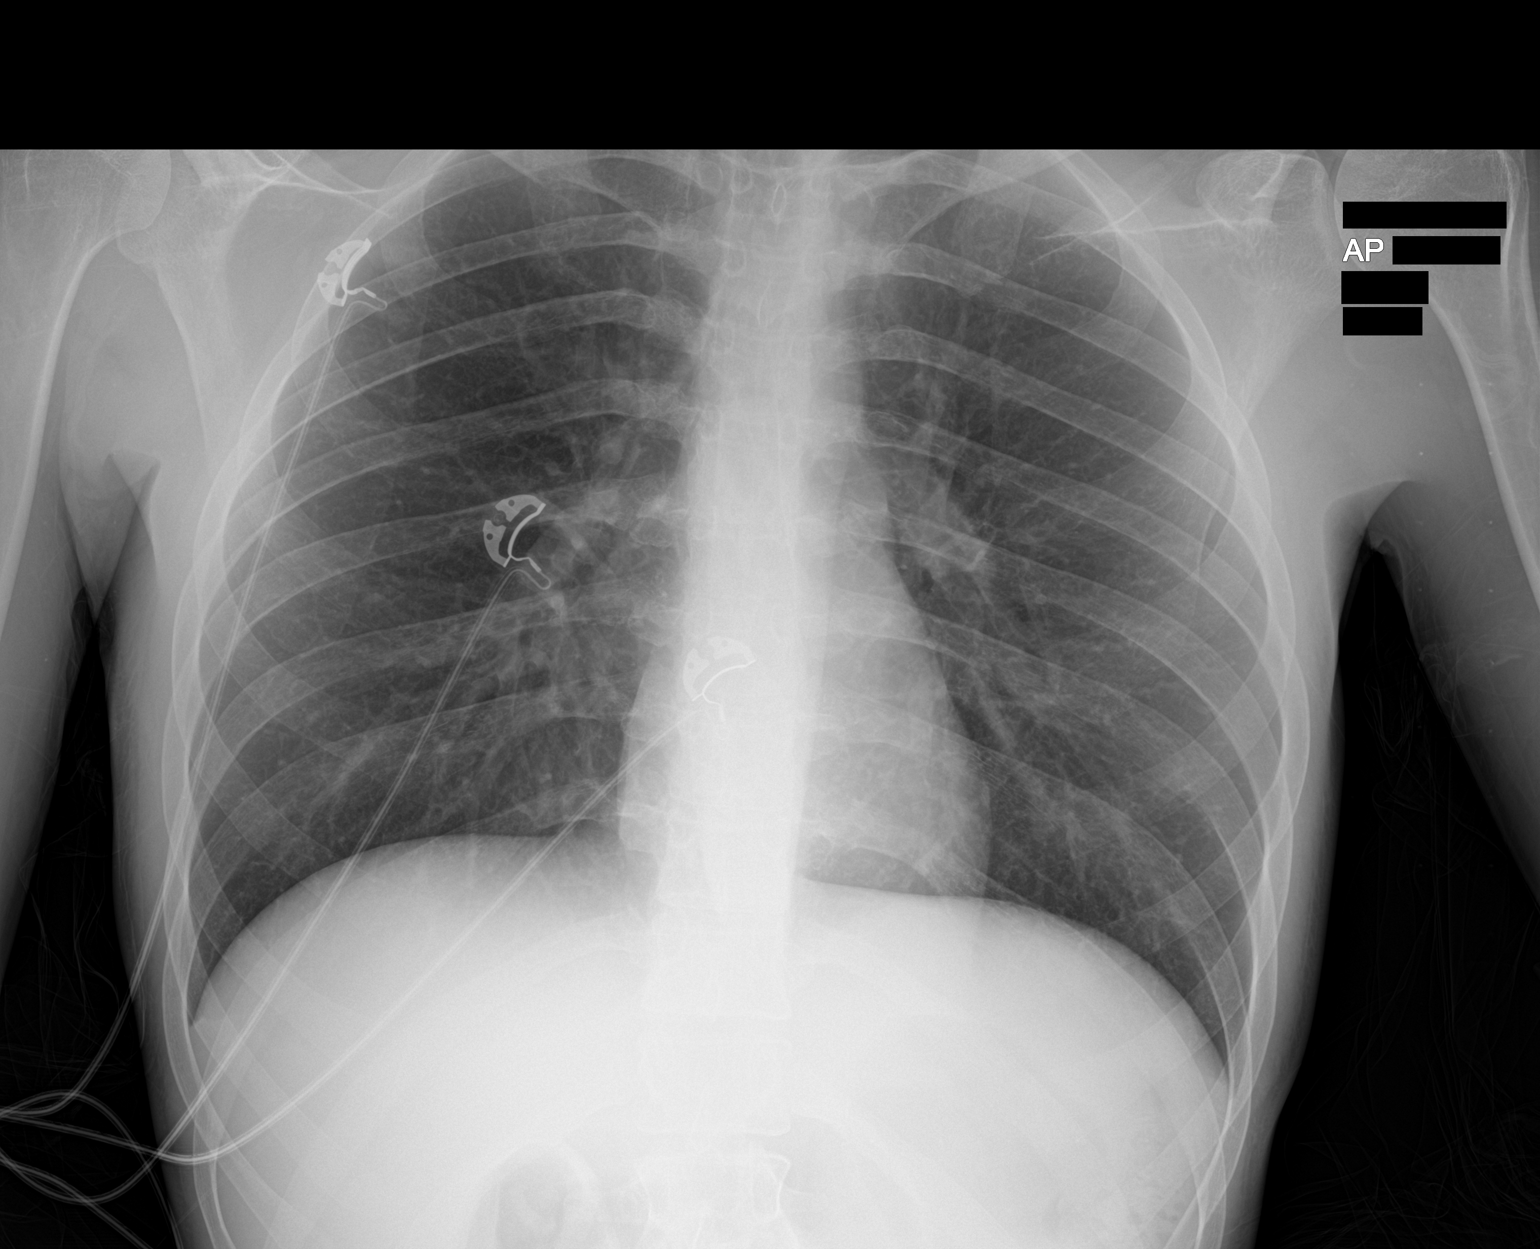

[1 of 1 positions shown; findings below may reference images not displayed]

FINDINGS: The heart size and mediastinal contours are within normal limits.
Both lungs are clear. The visualized skeletal structures are
unremarkable.
IMPRESSION: No active disease.

## 2020-05-01 ENCOUNTER — Telehealth: Payer: Self-pay | Admitting: Pharmacy Technician

## 2020-05-01 NOTE — Telephone Encounter (Signed)
Patient failed to provide 2021 proof of income.  No additional medication assistance will be provided by [facility] without the required proof of income documentation.  Patient notified by letter.  [name] Care Manager [facility]   [address] [place], [place]  [address]     This is to inform you that you are no longer eligible to receive medication assistance at [facility].  The reason(s) are:    _____Your total gross monthly household income exceeds 250% of the Federal Poverty Level.   _____Tangible assets (savings, checking, stocks/bonds, pension, retirement, etc.) exceeds our limit  _____You are eligible to receive benefits from Medicaid, [facility] or HIV Medication              Assistance Program _____You are eligible to receive benefits from a Medicare Part "D" plan _____You have prescription insurance  _____You are not an [place] resident __X__Failure to provide all requested proof of income information for 2021.    Medication assistance will resume once all requested financial information has been returned to our clinic.  If you have questions, please contact our clinic at [phone].    Thank you,  [facility]
